# Patient Record
Sex: Female | Born: 1980 | Hispanic: Yes | Marital: Married | Smoking: Never smoker
Health system: Southern US, Community
[De-identification: ages and names within clinical notes are randomized; demographics above are authoritative.]

## PROBLEM LIST (undated history)

## (undated) HISTORY — PX: NO PAST SURGERIES: SHX2092

---

## 2018-08-27 ENCOUNTER — Emergency Department
Admission: EM | Admit: 2018-08-27 | Discharge: 2018-08-27 | Disposition: A | Discharge status: Home / Self Care | Payer: Worker's Compensation | Attending: Emergency Medicine | Admitting: Emergency Medicine

## 2018-08-27 ENCOUNTER — Other Ambulatory Visit: Payer: Self-pay

## 2018-08-27 DIAGNOSIS — M79631 Pain in right forearm: Secondary | ICD-10-CM | POA: Diagnosis present

## 2018-08-27 DIAGNOSIS — M7711 Lateral epicondylitis, right elbow: Secondary | ICD-10-CM | POA: Insufficient documentation

## 2018-08-27 MED ORDER — MELOXICAM 15 MG PO TABS: 15.0000 mg | ORAL_TABLET | Freq: Every day | ORAL | 1 refills | Status: AC

## 2018-08-27 NOTE — ED Provider Notes (Signed)
[facility] Emergency Department Provider Note  ____________________________________________  Time seen: Approximately 8:21 PM  I have reviewed the triage vital signs and the nursing notes.   HISTORY  Chief Complaint Wrist Pain    HPI Stephanie Heath is a 37 y.o. female presents to the emergency department with right lateral forearm pain after engaging in repetitive tasks at work.  Patient denies numbness or tingling in the right hand.  No pain along the first dorsal extensor compartment.  Patient has never had similar symptoms in the past.  She currently rates her pain at 7 out of 10 in intensity and describes it as aching, worse with movement and relieved with rest.   No past medical history on file.  There are no active problems to display for this patient.     Prior to Admission medications   Medication Sig Start Date End Date Taking? Authorizing Provider  meloxicam (MOBIC) 15 MG tablet Take 1 tablet (15 mg total) by mouth daily for 7 days. 08/27/18 09/03/18  Orvil FeilWoods, Jaclyn M, PA-C    Allergies Patient has no known allergies.  No family history on file.  Social History Social History   Tobacco Use  . Smoking status: Not on file  Substance Use Topics  . Alcohol use: Not on file  . Drug use: Not on file     Review of Systems  Constitutional: No fever/chills Eyes: No visual changes. No discharge ENT: No upper respiratory complaints. Cardiovascular: no chest pain. Respiratory: no cough. No SOB. Gastrointestinal: No abdominal pain.  No nausea, no vomiting.  No diarrhea.  No constipation. Genitourinary: Negative for dysuria. No hematuria Musculoskeletal: Patient has right wrist pain.  Skin: Negative for rash, abrasions, lacerations, ecchymosis. Neurological: Negative for headaches, focal weakness or numbness.   ____________________________________________   PHYSICAL EXAM:  VITAL SIGNS: ED Triage Vitals [08/27/18 1902]  Enc Vitals Group      BP 101/71     Pulse Rate 63     Resp 16     Temp 98.3 F (36.8 C)     Temp Source Oral     SpO2 100 %     Weight 100 lb (45.4 kg)     Height 4\' 11"  (1.499 m)     Head Circumference      Peak Flow      Pain Score 8     Pain Loc      Pain Edu?      Excl. in GC?      Constitutional: Alert and oriented. Well appearing and in no acute distress. Eyes: Conjunctivae are normal. PERRL. EOMI. Head: Atraumatic. Cardiovascular: Normal rate, regular rhythm. Normal S1 and S2.  Good peripheral circulation. Respiratory: Normal respiratory effort without tachypnea or retractions. Lungs CTAB. Good air entry to the bases with no decreased or absent breath sounds. Musculoskeletal: Patient has 5 out of 5 strength in the upper extremities bilaterally and symmetrically.  Patient has reproducible pain with resisted extension at the right wrist.  Patient localizes pain to the right lateral epicondyle.  Patient has negative Tinel and Phalen. Negative Finkelstein test.  She is able to perform full range of motion without difficulty.  Palpable radial pulse, right. Neurologic:  Normal speech and language. No gross focal neurologic deficits are appreciated.  Skin:  Skin is warm, dry and intact. No rash noted.   ____________________________________________   LABS (all labs ordered are listed, but only abnormal results are displayed)  Labs Reviewed - No data to display ____________________________________________  EKG   ____________________________________________  RADIOLOGY   No results found.  ____________________________________________    PROCEDURES  Procedure(s) performed:    Procedures    Medications - No data to display   ____________________________________________   INITIAL IMPRESSION / ASSESSMENT AND PLAN / ED COURSE  Pertinent labs & imaging results that were available during my care of the patient were reviewed by me and considered in my medical decision making  (see chart for details).  Review of the [place] CSRS was performed in accordance of the [facility] prior to dispensing any controlled drugs.    Assessment and plan Lateral epicondylitis Patient presents to the emergency department with right lateral forearm pain after engaging in repetitive activities at work.  History and physical exam findings are consistent with lateral epicondylitis.  Ice and meloxicam were recommended.  Patient was given a wrist splint in the emergency department.  She was advised to follow-up with orthopedics as needed.  All patient questions were answered.     ____________________________________________  FINAL CLINICAL IMPRESSION(S) / ED DIAGNOSES  Final diagnoses:  Lateral epicondylitis of right elbow      NEW MEDICATIONS STARTED DURING THIS VISIT:  ED Discharge Orders         Ordered    meloxicam (MOBIC) 15 MG tablet  Daily     08/27/18 1954              This chart was dictated using voice recognition software/Dragon. Despite best efforts to proofread, errors can occur which can change the meaning. Any change was purely unintentional.    Orvil Feil, PA-C 08/27/18 2026    Sharyn Creamer, MD 08/27/18 [phone]

## 2018-08-27 NOTE — ED Triage Notes (Signed)
Pt states she was building something at work when she began to have right wrist pain extending to right elbow without known injury. No deformity or swelling noted.

## 2018-09-28 ENCOUNTER — Other Ambulatory Visit (HOSPITAL_COMMUNITY)
Admission: RE | Admit: 2018-09-28 | Discharge: 2018-09-28 | Disposition: A | Discharge status: Home / Self Care | Payer: BLUE CROSS/BLUE SHIELD | Source: Ambulatory Visit | Attending: Maternal Newborn | Admitting: Maternal Newborn

## 2018-09-28 ENCOUNTER — Ambulatory Visit (INDEPENDENT_AMBULATORY_CARE_PROVIDER_SITE_OTHER): Payer: Medicaid Other | Admitting: Maternal Newborn

## 2018-09-28 ENCOUNTER — Encounter: Payer: Self-pay | Admitting: Maternal Newborn

## 2018-09-28 VITALS — BP 80/60 | HR 60 | Ht 59.0 in | Wt 106.0 lb

## 2018-09-28 DIAGNOSIS — Z124 Encounter for screening for malignant neoplasm of cervix: Secondary | ICD-10-CM | POA: Insufficient documentation

## 2018-09-28 DIAGNOSIS — Z01419 Encounter for gynecological examination (general) (routine) without abnormal findings: Secondary | ICD-10-CM

## 2018-09-28 DIAGNOSIS — Z0000 Encounter for general adult medical examination without abnormal findings: Secondary | ICD-10-CM

## 2018-09-28 DIAGNOSIS — R350 Frequency of micturition: Secondary | ICD-10-CM

## 2018-09-28 NOTE — Progress Notes (Signed)
Gynecology Annual Exam  PCP: Patient, No Pcp Per  Chief Complaint:  Chief Complaint  Patient presents with  . Gynecologic Exam    d/c 2wks ago    History of Present Illness: Patient is a 37 y.o. [id number] presenting for an annual exam. The patient complains of urinary frequency today.   LMP: Patient's last menstrual period was 08/26/2018. Average Interval: regular, 30 days Duration of flow: 3 days Heavy Menses: yes Clots: no Intermenstrual Bleeding: no Postcoital Bleeding: no Dysmenorrhea: yes  The patient is sexually active. She currently uses none for contraception. She denies dyspareunia.  The patient does perform self breast exams.  There is no notable family history of breast or ovarian cancer in her family.  The patient wears seatbelts: yes.   The patient has regular exercise: no.    The patient denies current symptoms of depression.    Review of Systems: Review of Systems  Constitutional: Negative.   HENT: Negative.   Eyes: Negative.   Respiratory: Negative for cough, shortness of breath and wheezing.   Cardiovascular: Negative for chest pain and palpitations.  Gastrointestinal: Negative for abdominal pain, constipation, diarrhea and nausea.  Genitourinary: Positive for frequency. Negative for dysuria.  Skin: Negative.   Neurological: Negative.   Endo/Heme/Allergies: Negative.   Psychiatric/Behavioral: Negative.   All other systems reviewed and are negative.   Past Medical History:  History reviewed. No pertinent past medical history.  Past Surgical History:  Past Surgical History:  Procedure Laterality Date  . NO PAST SURGERIES      Gynecologic History:  Patient's last menstrual period was 08/26/2018. Contraception: none Last Pap: She reports that her last Pap was three years ago and that the result was normal.  Obstetric History: [id number]  Family History:  Family History  Problem Relation Age of Onset  . Diabetes Mother   . Cancer Cousin 28       thyroid    Social History:  Social History   Socioeconomic History  . Marital status: Married    Spouse name: Not on file  . Number of children: 2  . Years of education: 51  . Highest education level: Not on file  Occupational History  . Occupation: ASSEMBLY  Social Needs  . Financial resource strain: Not on file  . Food insecurity:    Worry: Not on file    Inability: Not on file  . Transportation needs:    Medical: Not on file    Non-medical: Not on file  Tobacco Use  . Smoking status: Never Smoker  . Smokeless tobacco: Never Used  Substance and Sexual Activity  . Alcohol use: Never    Frequency: Never  . Drug use: Never  . Sexual activity: Yes    Birth control/protection: None  Lifestyle  . Physical activity:    Days per week: Not on file    Minutes per session: Not on file  . Stress: Not on file  Relationships  . Social connections:    Talks on phone: Not on file    Gets together: Not on file    Attends religious service: Not on file    Active member of club or organization: Not on file    Attends meetings of clubs or organizations: Not on file    Relationship status: Not on file  . Intimate partner violence:    Fear of current or ex partner: Not on file    Emotionally abused: Not on file    Physically abused: Not on  file    Forced sexual activity: Not on file  Other Topics Concern  . Not on file  Social History Narrative  . Not on file    Allergies:  No Known Allergies  Medications: Prior to Admission medications   Not on File    Physical Exam Vitals: Blood pressure (!) 80/60, pulse 60, height 4\' 11"  (1.499 m), weight 106 lb (48.1 kg), last menstrual period 08/26/2018.  General: NAD HEENT: normocephalic, anicteric Thyroid: no enlargement, no palpable nodules Pulmonary: No increased work of breathing, CTAB Cardiovascular: RRR, no murmurs, rubs, or gallops Breast: Breasts symmetrical, no tenderness, no palpable nodules or masses, no skin or  nipple retraction present, no nipple discharge.  No axillary or supraclavicular lymphadenopathy. Abdomen: Soft, non-tender, non-distended.  Umbilicus without lesions.  No hepatomegaly, splenomegaly or masses palpable. No evidence of hernia  Genitourinary:  External: Normal external female genitalia.  Normal urethral  meatus, normal Bartholin's and Skene's glands.    Vagina: Normal vaginal mucosa, no evidence of prolapse.    Cervix: Grossly normal in appearance, small amount of  menstrual bleeding, cycle is starting  Uterus: Non-enlarged, mobile, normal contour.  No CMT  Adnexa: ovaries non-enlarged, no adnexal masses  Rectal: deferred  Lymphatic: no evidence of inguinal lymphadenopathy Extremities: no edema, erythema, or tenderness Neurologic: Grossly intact Psychiatric: mood appropriate, affect full  Assessment: 37 y.o. G4W1027 for a routine annual exam with urinary frequency.  Plan: Problem List Items Addressed This Visit    None    Visit Diagnoses    Women's annual routine gynecological examination    -  Primary   Pap smear for cervical cancer screening       Relevant Orders   Cytology - PAP   Increased urinary frequency       Relevant Orders   Urine Culture      1) STI screening was offered and declined.  2) ASCCP guidelines and rationale discussed.  Patient opts for every 3 year screening interval. Pap done today.  3) Contraception - Declines contraception.  4) Routine healthcare maintenance including cholesterol, diabetes screening: Declines  5) Urine culture sent to rule out UTI as cause of frequent urination.  6) Follow up 1 year for routine annual exam.  Marcelyn Bruins, CNM 09/28/2018  10:07 AM

## 2018-09-30 LAB — URINE CULTURE: Organism ID, Bacteria: NO GROWTH

## 2018-10-01 LAB — CYTOLOGY - PAP
DIAGNOSIS: NEGATIVE
HPV: NOT DETECTED

## 2019-05-31 ENCOUNTER — Other Ambulatory Visit: Payer: Self-pay

## 2019-05-31 ENCOUNTER — Ambulatory Visit
Admission: EM | Admit: 2019-05-31 | Discharge: 2019-05-31 | Disposition: A | Discharge status: Home / Self Care | Payer: BC Managed Care – PPO | Attending: Urgent Care | Admitting: Urgent Care

## 2019-05-31 ENCOUNTER — Encounter: Payer: Self-pay | Admitting: Emergency Medicine

## 2019-05-31 DIAGNOSIS — Z833 Family history of diabetes mellitus: Secondary | ICD-10-CM

## 2019-05-31 DIAGNOSIS — E860 Dehydration: Secondary | ICD-10-CM | POA: Diagnosis not present

## 2019-05-31 DIAGNOSIS — R748 Abnormal levels of other serum enzymes: Secondary | ICD-10-CM | POA: Diagnosis not present

## 2019-05-31 DIAGNOSIS — R1010 Upper abdominal pain, unspecified: Secondary | ICD-10-CM | POA: Diagnosis not present

## 2019-05-31 DIAGNOSIS — N7091 Salpingitis, unspecified: Secondary | ICD-10-CM | POA: Diagnosis present

## 2019-05-31 DIAGNOSIS — Z79891 Long term (current) use of opiate analgesic: Secondary | ICD-10-CM

## 2019-05-31 DIAGNOSIS — Z79899 Other long term (current) drug therapy: Secondary | ICD-10-CM

## 2019-05-31 DIAGNOSIS — K8500 Idiopathic acute pancreatitis without necrosis or infection: Principal | ICD-10-CM | POA: Diagnosis present

## 2019-05-31 DIAGNOSIS — N200 Calculus of kidney: Secondary | ICD-10-CM | POA: Diagnosis present

## 2019-05-31 DIAGNOSIS — K5289 Other specified noninfective gastroenteritis and colitis: Secondary | ICD-10-CM | POA: Diagnosis present

## 2019-05-31 DIAGNOSIS — Z1159 Encounter for screening for other viral diseases: Secondary | ICD-10-CM

## 2019-05-31 DIAGNOSIS — R1013 Epigastric pain: Secondary | ICD-10-CM | POA: Diagnosis not present

## 2019-05-31 LAB — COMPREHENSIVE METABOLIC PANEL
ALT: 14 U/L (ref 0–44)
AST: 16 U/L (ref 15–41)
Albumin: 4.1 g/dL (ref 3.5–5.0)
Alkaline Phosphatase: 69 U/L (ref 38–126)
Anion gap: 8 (ref 5–15)
BUN: 16 mg/dL (ref 6–20)
CO2: 26 mmol/L (ref 22–32)
Calcium: 8.9 mg/dL (ref 8.9–10.3)
Chloride: 101 mmol/L (ref 98–111)
Creatinine, Ser: 0.63 mg/dL (ref 0.44–1.00)
GFR calc Af Amer: >60 mL/min (ref >60)
GFR calc non Af Amer: >60 mL/min (ref >60)
Glucose, Bld: 92 mg/dL (ref 70–99)
Potassium: 3.4 mmol/L — ABNORMAL LOW (ref 3.5–5.1)
Sodium: 135 mmol/L (ref 135–145)
Total Bilirubin: 0.7 mg/dL (ref 0.3–1.2)
Total Protein: 7.6 g/dL (ref 6.5–8.1)

## 2019-05-31 LAB — URINALYSIS, COMPLETE (UACMP) WITH MICROSCOPIC
Bacteria, UA: NONE SEEN
Bilirubin Urine: NEGATIVE
Glucose, UA: NEGATIVE mg/dL
Hgb urine dipstick: NEGATIVE
Ketones, ur: 40 mg/dL — AB
Leukocytes,Ua: NEGATIVE
Nitrite: NEGATIVE
Protein, ur: NEGATIVE mg/dL
Specific Gravity, Urine: 1.02 (ref 1.005–1.030)
pH: 5 (ref 5.0–8.0)

## 2019-05-31 LAB — CBC WITH DIFFERENTIAL/PLATELET
Abs Immature Granulocytes: 0.02 10*3/uL (ref 0.00–0.07)
Basophils Absolute: 0 10*3/uL (ref 0.0–0.1)
Basophils Relative: 0 %
Eosinophils Absolute: 0.4 10*3/uL (ref 0.0–0.5)
Eosinophils Relative: 4 %
HCT: 43.1 % (ref 36.0–46.0)
Hemoglobin: 15.2 g/dL — ABNORMAL HIGH (ref 12.0–15.0)
Immature Granulocytes: 0 %
Lymphocytes Relative: 14 %
Lymphs Abs: 1.4 10*3/uL (ref 0.7–4.0)
MCH: 30.3 pg (ref 26.0–34.0)
MCHC: 35.3 g/dL (ref 30.0–36.0)
MCV: 86 fL (ref 80.0–100.0)
Monocytes Absolute: 0.8 10*3/uL (ref 0.1–1.0)
Monocytes Relative: 7 %
Neutro Abs: 7.8 10*3/uL — ABNORMAL HIGH (ref 1.7–7.7)
Neutrophils Relative %: 75 %
Platelets: 229 10*3/uL (ref 150–400)
RBC: 5.01 MIL/uL (ref 3.87–5.11)
RDW: 12.9 % (ref 11.5–15.5)
WBC: 10.4 10*3/uL (ref 4.0–10.5)
nRBC: 0 % (ref 0.0–0.2)

## 2019-05-31 LAB — LIPASE, BLOOD: Lipase: 1360 U/L — ABNORMAL HIGH (ref 11–51)

## 2019-05-31 LAB — PREGNANCY, URINE: Preg Test, Ur: NEGATIVE

## 2019-05-31 MED ORDER — LOPERAMIDE HCL 2 MG PO CAPS: 2.0000 mg | ORAL_CAPSULE | Freq: Four times a day (QID) | ORAL | 0 refills | Status: DC | PRN

## 2019-05-31 MED ORDER — ONDANSETRON HCL 4 MG PO TABS: 4.0000 mg | ORAL_TABLET | Freq: Four times a day (QID) | ORAL | 0 refills | Status: DC

## 2019-05-31 MED ORDER — HYDROCODONE-ACETAMINOPHEN 5-325 MG PO TABS: 1.0000 | ORAL_TABLET | Freq: Three times a day (TID) | ORAL | 0 refills | Status: DC | PRN

## 2019-05-31 NOTE — ED Triage Notes (Signed)
Patient complains of upper abdominal pain, nausea and vomiting that started yesterday. Patient states that she has also been having diarrhea.

## 2019-05-31 NOTE — ED Provider Notes (Addendum)
[address], [place], [place] [address] [phone]   Name: Stephanie Heath DOB: 07/06/1981 MRN: [id number] CSN: [id number] PCP: Patient, No Pcp Per  Arrival date and time:  05/31/19 1640  Chief Complaint:  Abdominal Pain  NOTE: Prior to seeing the patient today, I have reviewed the triage nursing documentation and vital signs. Clinical staff has updated patient's PMH/PSHx, current medication list, and drug allergies/intolerances to ensure comprehensive history available to assist in medical decision making.   History:   HPI: Stephanie Heath is a 38 y.o. female who presents today with complaints of upper abdominal pain that started with acute onset yesterday (05/30/2019).  Patient notes that pain is sharp and stabbing in nature, and radiates virtually across the entire upper aspect of her abdomen.  Pain does not radiate into her back or shoulder. She has had associated nausea, vomiting, diarrhea.  Pain has been constant and does not seem to be influenced by eating. Her oral intake has been decrease overall since onset of her symptoms. Patient denies any fevers or chills.  Patient denies any urinary symptoms; no dysuria, frequency, urgency, or lower back pain.  Patient denies alcohol consumption.  History reviewed. No pertinent past medical history.  Past Surgical History:  Procedure Laterality Date  . NO PAST SURGERIES      Family History  Problem Relation Age of Onset  . Diabetes Mother   . Cancer Cousin 57       thyroid    Social History   Socioeconomic History  . Marital status: Married    Spouse name: Not on file  . Number of children: 2  . Years of education: 47  . Highest education level: Not on file  Occupational History  . Occupation: ASSEMBLY  Social Needs  . Financial resource strain: Not on file  . Food insecurity    Worry: Not on file    Inability: Not on file  . Transportation needs    Medical: Not on file    Non-medical: Not on file  Tobacco  Use  . Smoking status: Never Smoker  . Smokeless tobacco: Never Used  Substance and Sexual Activity  . Alcohol use: Never    Frequency: Never  . Drug use: Never  . Sexual activity: Yes    Birth control/protection: None  Lifestyle  . Physical activity    Days per week: Not on file    Minutes per session: Not on file  . Stress: Not on file  Relationships  . Social Herbalist on phone: Not on file    Gets together: Not on file    Attends religious service: Not on file    Active member of club or organization: Not on file    Attends meetings of clubs or organizations: Not on file    Relationship status: Not on file  . Intimate partner violence    Fear of current or ex partner: Not on file    Emotionally abused: Not on file    Physically abused: Not on file    Forced sexual activity: Not on file  Other Topics Concern  . Not on file  Social History Narrative  . Not on file    There are no active problems to display for this patient.   Home Medications:    No outpatient medications have been marked as taking for the 05/31/19 encounter [facility] Encounter).    Allergies:   Patient has no known allergies.  Review of Systems (ROS): Review of Systems  Constitutional: Positive for appetite change (Decreased). Negative for chills and fever.  HENT: Negative for congestion, rhinorrhea, sore throat and trouble swallowing.   Respiratory: Negative for cough and shortness of breath.   Cardiovascular: Negative for chest pain and palpitations.  Gastrointestinal: Positive for abdominal pain (Upper), diarrhea, nausea and vomiting. Negative for constipation and rectal pain.  Genitourinary: Positive for frequency. Negative for dysuria, flank pain, hematuria, pelvic pain, urgency and vaginal pain.  Musculoskeletal: Negative for back pain and neck pain.  Skin: Negative for pallor and rash.  Neurological: Negative for dizziness, syncope, weakness, numbness and headaches.   Hematological: Negative for adenopathy.  Psychiatric/Behavioral: The patient is nervous/anxious.      Physical Exam:  Triage Vital Signs ED Triage Vitals  Enc Vitals Group     BP 05/31/19 1654 115/88     Pulse Rate 05/31/19 1654 73     Resp 05/31/19 1654 18     Temp 05/31/19 1654 98.3 F (36.8 C)     Temp Source 05/31/19 1654 Oral     SpO2 05/31/19 1654 100 %     Weight 05/31/19 1653 110 lb (49.9 kg)     Height 05/31/19 1653 4\' 11"  (1.499 m)     Head Circumference --      Peak Flow --      Pain Score 05/31/19 1653 10     Pain Loc --      Pain Edu? --      Excl. in GC? --     Physical Exam  Constitutional: She is oriented to person, place, and time and well-developed, well-nourished, and in no distress. No distress.  HENT:  Head: Normocephalic and atraumatic.  Mouth/Throat: Oropharynx is clear and moist and mucous membranes are normal.  Eyes: Pupils are equal, round, and reactive to light. EOM are normal.  Neck: Normal range of motion. Neck supple.  Cardiovascular: Normal rate, regular rhythm, normal heart sounds and intact distal pulses. Exam reveals no gallop and no friction rub.  No murmur heard. Pulmonary/Chest: Effort normal and breath sounds normal. No respiratory distress. She has no wheezes. She has no rales.  Abdominal: Soft. Normal appearance and bowel sounds are normal. She exhibits no distension and no ascites. There is no hepatosplenomegaly. There is abdominal tenderness in the right upper quadrant, epigastric area and left upper quadrant. There is no rebound and no CVA tenderness.  Lymphadenopathy:    She has no cervical adenopathy.  Neurological: She is alert and oriented to person, place, and time.  Skin: Skin is warm and dry. No rash noted. She is not diaphoretic. No erythema.  Psychiatric: Affect and judgment normal. Her mood appears anxious.  Nursing note and vitals reviewed.    Urgent Care Treatments / Results:   LABS: PLEASE NOTE: all labs that  were ordered this encounter are listed, however only abnormal results are displayed. Labs Reviewed  CBC WITH DIFFERENTIAL/PLATELET - Abnormal; Notable for the following components:      Result Value   Hemoglobin 15.2 (*)    Neutro Abs 7.8 (*)    All other components within normal limits  COMPREHENSIVE METABOLIC PANEL - Abnormal; Notable for the following components:   Potassium 3.4 (*)    All other components within normal limits  LIPASE, BLOOD - Abnormal; Notable for the following components:   Lipase 1,360 (*)    All other components within normal limits  URINALYSIS, COMPLETE (UACMP) WITH MICROSCOPIC - Abnormal; Notable for the following components:   Ketones, ur 40 (*)  All other components within normal limits  PREGNANCY, URINE    EKG: -None  RADIOLOGY: No results found.  PROCEDURES: Procedures  MEDICATIONS RECEIVED THIS VISIT: Medications - No data to display  PERTINENT CLINICAL COURSE NOTES/UPDATES:   Initial Impression / Assessment and Plan / Urgent Care Course:    Stephanie Heath is a 38 y.o. female who [facility] to [facility] today with complaints of Abdominal Pain  Pertinent labs & imaging results that were available during my care of the patient were personally reviewed by me and considered in my medical decision making (see lab/imaging section of note for values and interpretations).  Exam reveals generalized upper abdominal tenderness that does not radiate into back or shoulder.  Nausea controlled in clinic.  She has not experienced any episodes of emesis or diarrhea while here. Her VS are noted to be stable. CBC and CMP normal.  UA positive for ketonuria, however negative for infection.  Her urine HCG was negative.  Lipase returned elevated at 1360 U/L. In light of the fact that patient does not consume ETOH, we discussed that there is concern for an obstructive etiology causing her pain and elevated lipase level. With the tenderness noted on exam,  cholecystitis with concurrent choledocholithiasis is a distinct possibility.    Ultrasound imaging not available in the urgent care setting at this time.  Reviewed need for patient to be seen and the emergency department for further evaluation.  Patient will ultimately need ultrasound imaging, IV fluids, and IV pain medication.  Patient in agreement with plan of care, however notes that she cannot go to the ER until after her husband gets off of work around 10 or 11 tonight.  Offered EMS transportation, however patient refused. Patient was provided written information on abdominal pain and pancreatitis on her AVS today as I wanted her to have information on her elevated enzyme level. Discussed that she further testing would be requested to confirm pancreatitis diagnosis. Deferred patient information on cholecystitis/choledocholithiasis in the absence of confirmatory imaging.  Will send in prescription for pain medication (Norco 5/325), nausea medication (ondansetron), and antidiarrheal (loperamide) for patient to use until she can get to the emergency department later on tonight.  I have spoken with patient's husband via telephone and informed him patient symptoms and my concerns.  Husband verbalizes that he will take patient into the emergency department later on tonight as discussed.  Patient report communicated to the [facility] emergency department. Spoke with Beaulah Corinoyne, RN via [facility] secure chat.  Advised of presenting complaints, assessment, and discharge plan of care that included clear directives for the patient to present to the emergency department for further evaluation and care tonight.   Made aware the patient will be presenting to their facility later on tonight via POV.   Final Clinical Impressions(s) / Urgent Care Diagnoses:   1. Pain of upper abdomen   2. Elevated lipase     New Prescriptions:   Meds ordered this encounter  Medications  . ondansetron (ZOFRAN) 4 MG tablet    Sig: Take 1 tablet  (4 mg total) by mouth every 6 (six) hours.    Dispense:  12 tablet    Refill:  0  . loperamide (IMODIUM) 2 MG capsule    Sig: Take 1 capsule (2 mg total) by mouth 4 (four) times daily as needed for diarrhea or loose stools.    Dispense:  12 capsule    Refill:  0  . HYDROcodone-acetaminophen (NORCO) 5-325 MG tablet  Sig: Take 1 tablet by mouth every 8 (eight) hours as needed for moderate pain.    Dispense:  12 tablet    Refill:  0    Controlled Substance Prescriptions:  [place] Controlled Substance Registry consulted? Yes -  I have consulted the [place] Controlled Substances Registry for this patient, and feel the risk/benefit ratio today is favorable for proceeding with this prescription for a controlled substance.  NOTE: This note was prepared using Scientist, clinical (histocompatibility and immunogenetics)Dragon dictation software along with smaller Lobbyistphrase technology. Despite my best ability to proofread, there is the potential that transcriptional errors may still occur from this process, and are completely unintentional.      Verlee MonteGray, Taariq Leitz E, NP 05/31/19 [phone]

## 2019-05-31 NOTE — Discharge Instructions (Addendum)
I have given your some pain and nausea medication. You need to go to the ER tonight for further evalution. I want to try to control your symptoms until your husband can take you.   Honor Loh, MSN, APRN, FNP-C, CEN Advanced Practice Provider [facility] 05/31/2019 6:34 PM

## 2019-05-31 NOTE — ED Triage Notes (Signed)
Pt presents to ED from [facility] urgent care with c/o generalized abd pain/cramping and diarrhea for the past 4 days. Pt states she was seen at urgent care and they sent her to ED due to concerns that pt has pancreatitis. Pt states cramping increased today.

## 2019-06-01 ENCOUNTER — Inpatient Hospital Stay
Admission: EM | Admit: 2019-06-01 | Discharge: 2019-06-04 | DRG: 440 | Disposition: A | Discharge status: Home / Self Care | Payer: BC Managed Care – PPO | Source: Other Acute Inpatient Hospital | Attending: Internal Medicine | Admitting: Internal Medicine

## 2019-06-01 ENCOUNTER — Emergency Department: Payer: BC Managed Care – PPO

## 2019-06-01 ENCOUNTER — Other Ambulatory Visit: Payer: Self-pay

## 2019-06-01 DIAGNOSIS — R109 Unspecified abdominal pain: Secondary | ICD-10-CM | POA: Diagnosis present

## 2019-06-01 DIAGNOSIS — R1013 Epigastric pain: Secondary | ICD-10-CM

## 2019-06-01 DIAGNOSIS — K859 Acute pancreatitis without necrosis or infection, unspecified: Secondary | ICD-10-CM

## 2019-06-01 DIAGNOSIS — N7011 Chronic salpingitis: Secondary | ICD-10-CM

## 2019-06-01 LAB — COMPREHENSIVE METABOLIC PANEL
ALT: 14 U/L (ref 0–44)
AST: 17 U/L (ref 15–41)
Albumin: 4 g/dL (ref 3.5–5.0)
Alkaline Phosphatase: 66 U/L (ref 38–126)
Anion gap: 9 (ref 5–15)
BUN: 13 mg/dL (ref 6–20)
CO2: 28 mmol/L (ref 22–32)
Calcium: 8.9 mg/dL (ref 8.9–10.3)
Chloride: 102 mmol/L (ref 98–111)
Creatinine, Ser: 0.62 mg/dL (ref 0.44–1.00)
GFR calc Af Amer: >60 mL/min (ref >60)
GFR calc non Af Amer: >60 mL/min (ref >60)
Glucose, Bld: 111 mg/dL — ABNORMAL HIGH (ref 70–99)
Potassium: 3.6 mmol/L (ref 3.5–5.1)
Sodium: 139 mmol/L (ref 135–145)
Total Bilirubin: 0.6 mg/dL (ref 0.3–1.2)
Total Protein: 7.1 g/dL (ref 6.5–8.1)

## 2019-06-01 LAB — SARS CORONAVIRUS 2 BY RT PCR (HOSPITAL ORDER, PERFORMED IN ~~LOC~~ HOSPITAL LAB): SARS Coronavirus 2: NEGATIVE

## 2019-06-01 LAB — CBC WITH DIFFERENTIAL/PLATELET
Abs Immature Granulocytes: 0.03 10*3/uL (ref 0.00–0.07)
Basophils Absolute: 0 10*3/uL (ref 0.0–0.1)
Basophils Relative: 0 %
Eosinophils Absolute: 0.7 10*3/uL — ABNORMAL HIGH (ref 0.0–0.5)
Eosinophils Relative: 6 %
HCT: 41.1 % (ref 36.0–46.0)
Hemoglobin: 14.1 g/dL (ref 12.0–15.0)
Immature Granulocytes: 0 %
Lymphocytes Relative: 16 %
Lymphs Abs: 1.8 10*3/uL (ref 0.7–4.0)
MCH: 29.5 pg (ref 26.0–34.0)
MCHC: 34.3 g/dL (ref 30.0–36.0)
MCV: 86 fL (ref 80.0–100.0)
Monocytes Absolute: 0.7 10*3/uL (ref 0.1–1.0)
Monocytes Relative: 6 %
Neutro Abs: 8.2 10*3/uL — ABNORMAL HIGH (ref 1.7–7.7)
Neutrophils Relative %: 72 %
Platelets: 243 10*3/uL (ref 150–400)
RBC: 4.78 MIL/uL (ref 3.87–5.11)
RDW: 12.6 % (ref 11.5–15.5)
WBC: 11.4 10*3/uL — ABNORMAL HIGH (ref 4.0–10.5)
nRBC: 0 % (ref 0.0–0.2)

## 2019-06-01 LAB — LIPASE, BLOOD: Lipase: 127 U/L — ABNORMAL HIGH (ref 11–51)

## 2019-06-01 MED ORDER — HYDROCODONE-ACETAMINOPHEN 5-325 MG PO TABS
1.0000 | ORAL_TABLET | Freq: Four times a day (QID) | ORAL | Status: DC | PRN
  Administered 2019-06-02 (×2): 1 via ORAL
  Filled 2019-06-01 (×2): qty 1

## 2019-06-01 MED ORDER — ONDANSETRON HCL 4 MG PO TABS: 4.0000 mg | ORAL_TABLET | Freq: Four times a day (QID) | ORAL | Status: DC | PRN

## 2019-06-01 MED ORDER — SODIUM CHLORIDE 0.9 % IV SOLN
INTRAVENOUS | Status: DC
  Administered 2019-06-01 – 2019-06-04 (×8): via INTRAVENOUS

## 2019-06-01 MED ORDER — ONDANSETRON HCL 4 MG/2ML IJ SOLN
4.0000 mg | Freq: Four times a day (QID) | INTRAMUSCULAR | Status: DC | PRN
  Administered 2019-06-01: 4 mg via INTRAVENOUS
  Filled 2019-06-01: qty 2

## 2019-06-01 MED ORDER — SODIUM CHLORIDE 0.9 % IV BOLUS
1000.0000 mL | Freq: Once | INTRAVENOUS | Status: AC
  Administered 2019-06-01: 1000 mL via INTRAVENOUS

## 2019-06-01 MED ORDER — MORPHINE SULFATE (PF) 2 MG/ML IV SOLN
1.0000 mg | INTRAVENOUS | Status: DC | PRN
  Administered 2019-06-01 (×2): 2 mg via INTRAVENOUS
  Filled 2019-06-01 (×2): qty 1

## 2019-06-01 MED ORDER — ENOXAPARIN SODIUM 40 MG/0.4ML ~~LOC~~ SOLN
40.0000 mg | SUBCUTANEOUS | Status: DC
  Administered 2019-06-01 – 2019-06-04 (×4): 40 mg via SUBCUTANEOUS
  Filled 2019-06-01 (×4): qty 0.4

## 2019-06-01 MED ORDER — ONDANSETRON HCL 4 MG/2ML IJ SOLN
4.0000 mg | Freq: Once | INTRAMUSCULAR | Status: AC
  Administered 2019-06-01: 4 mg via INTRAVENOUS
  Filled 2019-06-01: qty 2

## 2019-06-01 MED ORDER — ACETAMINOPHEN 325 MG PO TABS: 650.0000 mg | ORAL_TABLET | Freq: Four times a day (QID) | ORAL | Status: DC | PRN

## 2019-06-01 MED ORDER — FENTANYL CITRATE (PF) 100 MCG/2ML IJ SOLN
50.0000 ug | Freq: Once | INTRAMUSCULAR | Status: AC
  Administered 2019-06-01: 50 ug via INTRAVENOUS
  Filled 2019-06-01: qty 2

## 2019-06-01 MED ORDER — ACETAMINOPHEN 650 MG RE SUPP: 650.0000 mg | Freq: Four times a day (QID) | RECTAL | Status: DC | PRN

## 2019-06-01 NOTE — ED Notes (Signed)
ED TO INPATIENT HANDOFF REPORT  ED Nurse Name and Phone #: Lorriane Shire [phone]  S Name/Age/Gender Stephanie Heath 38 y.o. female Room/Bed: ED16A/ED16A  Code Status   Code Status: Not on file  Home/SNF/Other Home Patient oriented to: self, place, time and situation Is this baseline? Yes   Triage Complete: Triage complete  Chief Complaint abd pain  Triage Note Pt presents to ED from [facility] urgent care with c/o generalized abd pain/cramping and diarrhea for the past 4 days. Pt states she was seen at urgent care and they sent her to ED due to concerns that pt has pancreatitis. Pt states cramping increased today.    Allergies No Known Allergies  Level of Care/Admitting Diagnosis ED Disposition    ED Disposition Condition Comment   Admit  The patient appears reasonably stabilized for admission considering the current resources, flow, and capabilities available in the ED at this time, and I doubt any other [facility] requiring further screening and/or treatment in the ED prior to admission is  present.       B Medical/Surgery History History reviewed. No pertinent past medical history. Past Surgical History:  Procedure Laterality Date  . NO PAST SURGERIES       A IV Location/Drains/Wounds Patient Lines/Drains/Airways Status   Active Line/Drains/Airways    Name:   Placement date:   Placement time:   Site:   Days:   Peripheral IV 06/01/19 Right Hand   06/01/19    0110    Hand   less than 1          Intake/Output Last 24 hours No intake or output data in the 24 hours ending 06/01/19 0255  Labs/Imaging Results for orders placed or performed during the hospital encounter of 06/01/19 (from the past 48 hour(s))  CBC with Differential     Status: Abnormal   Collection Time: 06/01/19  1:17 AM  Result Value Ref Range   WBC 11.4 (H) 4.0 - 10.5 K/uL   RBC 4.78 3.87 - 5.11 MIL/uL   Hemoglobin 14.1 12.0 - 15.0 g/dL   HCT 41.1 36.0 - 46.0 %   MCV 86.0 80.0 - 100.0 fL   MCH 29.5 26.0 -  34.0 pg   MCHC 34.3 30.0 - 36.0 g/dL   RDW 12.6 11.5 - 15.5 %   Platelets 243 150 - 400 K/uL   nRBC 0.0 0.0 - 0.2 %   Neutrophils Relative % 72 %   Neutro Abs 8.2 (H) 1.7 - 7.7 K/uL   Lymphocytes Relative 16 %   Lymphs Abs 1.8 0.7 - 4.0 K/uL   Monocytes Relative 6 %   Monocytes Absolute 0.7 0.1 - 1.0 K/uL   Eosinophils Relative 6 %   Eosinophils Absolute 0.7 (H) 0.0 - 0.5 K/uL   Basophils Relative 0 %   Basophils Absolute 0.0 0.0 - 0.1 K/uL   Immature Granulocytes 0 %   Abs Immature Granulocytes 0.03 0.00 - 0.07 K/uL    Comment: Performed at [facility], [address] [place], [place] [address]  Comprehensive metabolic panel     Status: Abnormal   Collection Time: 06/01/19  1:17 AM  Result Value Ref Range   Sodium 139 135 - 145 mmol/L   Potassium 3.6 3.5 - 5.1 mmol/L   Chloride 102 98 - 111 mmol/L   CO2 28 22 - 32 mmol/L   Glucose, Bld 111 (H) 70 - 99 mg/dL   BUN 13 6 - 20 mg/dL   Creatinine, Ser 0.62 0.44 - 1.00  mg/dL   Calcium 8.9 8.9 - 16.110.3 mg/dL   Total Protein 7.1 6.5 - 8.1 g/dL   Albumin 4.0 3.5 - 5.0 g/dL   AST 17 15 - 41 U/L   ALT 14 0 - 44 U/L   Alkaline Phosphatase 66 38 - 126 U/L   Total Bilirubin 0.6 0.3 - 1.2 mg/dL   GFR calc non Af Amer >60 >60 mL/min   GFR calc Af Amer >60 >60 mL/min   Anion gap 9 5 - 15    Comment: Performed at [facility], [address] [place], [place] [address]  Lipase, blood     Status: Abnormal   Collection Time: 06/01/19  1:17 AM  Result Value Ref Range   Lipase 127 (H) 11 - 51 U/L    Comment: Performed at [facility], [address] [place], [place] [address]  SARS Coronavirus 2 (CEPHEID - Performed in [facility] hospital lab), Hosp Order     Status: None   Collection Time: 06/01/19  1:17 AM   Specimen: Nasopharyngeal Swab  Result Value Ref Range   SARS Coronavirus 2 NEGATIVE NEGATIVE    Comment: (NOTE) If result is NEGATIVE SARS-CoV-2 target nucleic acids are NOT DETECTED. The  SARS-CoV-2 RNA is generally detectable in upper and lower  respiratory specimens during the acute phase of infection. The lowest  concentration of SARS-CoV-2 viral copies this assay can detect is 250  copies / mL. A negative result does not preclude SARS-CoV-2 infection  and should not be used as the sole basis for treatment or other  patient management decisions.  A negative result may occur with  improper specimen collection / handling, submission of specimen other  than nasopharyngeal swab, presence of viral mutation(s) within the  areas targeted by this assay, and inadequate number of viral copies  (<250 copies / mL). A negative result must be combined with clinical  observations, patient history, and epidemiological information. If result is POSITIVE SARS-CoV-2 target nucleic acids are DETECTED. The SARS-CoV-2 RNA is generally detectable in upper and lower  respiratory specimens dur ing the acute phase of infection.  Positive  results are indicative of active infection with SARS-CoV-2.  Clinical  correlation with patient history and other diagnostic information is  necessary to determine patient infection status.  Positive results do  not rule out bacterial infection or co-infection with other viruses. If result is PRESUMPTIVE POSTIVE SARS-CoV-2 nucleic acids MAY BE PRESENT.   A presumptive positive result was obtained on the submitted specimen  and confirmed on repeat testing.  While 2019 novel coronavirus  (SARS-CoV-2) nucleic acids may be present in the submitted sample  additional confirmatory testing may be necessary for epidemiological  and / or clinical management purposes  to differentiate between  SARS-CoV-2 and other Sarbecovirus currently known to infect humans.  If clinically indicated additional testing with an alternate test  methodology [phone]([id number]) is advised. The SARS-CoV-2 RNA is generally  detectable in upper and lower respiratory sp ecimens during the acute   phase of infection. The expected result is Negative. Fact Sheet for Patients:  BoilerBrush.com.cyhttps://www.fda.gov/media/136312/download Fact Sheet for Healthcare Providers: https://pope.com/https://www.fda.gov/media/136313/download This test is not yet approved or cleared by the [place] FDA and has been authorized for detection and/or diagnosis of SARS-CoV-2 by FDA under an Emergency Use Authorization (EUA).  This EUA will remain in effect (meaning this test can be used) for the duration of the COVID-19 declaration under Section 564(b)(1) of the Act, 21 U.S.C. section 360bbb-3(b)(1), unless  the authorization is terminated or revoked sooner. Performed at [facility], [address] [place], [place] [address]    [place] Abdomen Limited Ruq  Result Date: 06/01/2019 CLINICAL DATA:  pancreatitis, evaluate for cholecystitis EXAM: ULTRASOUND ABDOMEN LIMITED RIGHT UPPER QUADRANT COMPARISON:  None. FINDINGS: Gallbladder: No gallstones or wall thickening visualized. No sonographic Murphy sign noted by sonographer. Common bile duct: Diameter: Normal caliber, 4 mm Liver: Small benign calcification. Normal echotexture. No suspicious mass or biliary duct dilatation. Portal vein is patent on color Doppler imaging with normal direction of blood flow towards the liver. IMPRESSION: No acute findings.  No evidence of cholelithiasis or cholecystitis. Electronically Signed   By: Charlett NoseKevin  Dover M.D.   On: 06/01/2019 02:18    Pending Labs Unresulted Labs (From admission, onward)   None      Vitals/Pain Today's Vitals   05/31/19 2246 05/31/19 2247  BP: 104/74   Pulse: 76   Resp: 18   Temp: 98.1 F (36.7 C)   TempSrc: Oral   SpO2: 97%   Weight:  49.9 kg  Height:  4\' 11"  (1.499 m)  PainSc: 6      Isolation Precautions No active isolations  Medications Medications  sodium chloride 0.9 % bolus 1,000 mL (1,000 mLs Intravenous New Bag/Given 06/01/19 0116)  fentaNYL (SUBLIMAZE) injection 50 mcg (50 mcg Intravenous  Given 06/01/19 0116)  ondansetron (ZOFRAN) injection 4 mg (4 mg Intravenous Given 06/01/19 0116)    Mobility walks Low fall risk   Focused Assessments Renal Assessment Handoff:     R Recommendations: See Admitting Provider Note  Report given to:   Additional Notes:

## 2019-06-01 NOTE — Consult Note (Signed)
GI Inpatient Consult Note  Reason for Consult: Epigastric abd pain, nausea/vomiting   Attending Requesting Consult: Dr. Rosilyn Mings, MD  History of Present Illness: Stephanie Heath is a 38 y.o. female seen for evaluation of epigastric abd pain with associated nausea/vomiting at the request of Dr. Marcille Blanco. Patient presented to the [facility] yesterday afternoon 2-day histroy of acute epigastric pain with associated nausea, vomiting, and diarrhea. Lab work-up revealed lipase 1360, WBC 10.4, hemoglobin 15.2, AST 16, ALT 14, alk phos 69. She was advised to come to the [facility] ED for imaging and pain management and her husband took her to the ED around 11 PM last night. Lipase upon admission to the [facility] ED showed lipase 127. She had RUQ US showing no gallstones or wall thickening and no extrahepatic biliary ductal dilatation.   Patient seen and examined this afternoon resting comfortably in hospital bed. She reports no recurrent nausea or episodes of vomiting. There have been no diarrhea episodes since admission. She was started on clear liquid diet and was able to eat some chicken broth and frozen ice. She reports after eating she had a little bit of bilateral upper abd pain, but that has resolved. Currently, she ranks her pain level a 4/10. She reports she feels a lot better than yesterday. She has not required any IV pain medication today. She reports on Monday evening she fixed lasagna for her family and reports there has been a recall on some ground beef for E.coli. She reports several members of her household have also experienced some diarrhea, but no one in the house has had abd pain. She denies any family history of pancreatitis or pancreatic cancer. There is no known family hx of autoimmune diseases. There have been no recent medication changes or OTC supplements. She denies any alcohol, tobacco, or illicit drug use. She denies any history of hypertriglyceridemia. There has been no unintentional  weight loss.    Last Colonoscopy: N/A Last Endoscopy: N/A   Past Medical History:  History reviewed. No pertinent past medical history.  Problem List: Patient Active Problem List   Diagnosis Date Noted  . Intractable abdominal pain 06/01/2019    Past Surgical History: Past Surgical History:  Procedure Laterality Date  . NO PAST SURGERIES      Allergies: No Known Allergies  Home Medications: Medications Prior to Admission  Medication Sig Dispense Refill Last Dose  . HYDROcodone-acetaminophen (NORCO) 5-325 MG tablet Take 1 tablet by mouth every 8 (eight) hours as needed for moderate pain. 12 tablet 0 prn at prn  . loperamide (IMODIUM) 2 MG capsule Take 1 capsule (2 mg total) by mouth 4 (four) times daily as needed for diarrhea or loose stools. 12 capsule 0 prn at prn  . ondansetron (ZOFRAN) 4 MG tablet Take 1 tablet (4 mg total) by mouth every 6 (six) hours. 12 tablet 0 prn at prn   Home medication reconciliation was completed with the patient.   Scheduled Inpatient Medications:   . enoxaparin (LOVENOX) injection  40 mg Subcutaneous Q24H    Continuous Inpatient Infusions:   . sodium chloride 100 mL/hr at 06/01/19 1200    PRN Inpatient Medications:  acetaminophen **OR** acetaminophen, morphine injection, ondansetron **OR** ondansetron (ZOFRAN) IV  Family History: family history includes Cancer (age of onset: 9) in her cousin; Diabetes in her mother.  The patient's family history is negative for inflammatory bowel disorders, GI malignancy, or solid organ transplantation.  Social History:   reports that she has never smoked. She  has never used smokeless tobacco. She reports that she does not drink alcohol or use drugs. The patient denies ETOH, tobacco, or drug use.   Review of Systems: Constitutional: Weight is stable.  Eyes: No changes in vision. ENT: No oral lesions, sore throat.  GI: see HPI.  Heme/Lymph: No easy bruising.  CV: No chest pain.  GU: No hematuria.   Integumentary: No rashes.  Neuro: No headaches.  Psych: No depression/anxiety.  Endocrine: No heat/cold intolerance.  Allergic/Immunologic: No urticaria.  Resp: No cough, SOB.  Musculoskeletal: No joint swelling.    Physical Examination: BP 100/71 (BP Location: Left Arm)   Pulse 69   Temp 98.2 F (36.8 C) (Oral)   Resp 15   Ht _0  (1.499 m)   Wt 49.9 kg   LMP 05/24/2019   SpO2 99%   BMI 22.22 kg/m   Non-toxic appearing Hispanic female in hospital bed, no accessory muscle use Gen: NAD, alert and oriented x 4 HEENT: PEERLA, EOMI, Neck: supple, no JVD or thyromegaly Chest: CTA bilaterally, no wheezes, crackles, or other adventitious sounds CV: RRR, no m/g/c/r Abd: soft, nondistended, hyperactive BS in all four quadrants; nontender to palpation in RUQ, epigastric, LUQ, no lower abd tenderness to light or deep palpation, no HSM, guarding, ridigity, or rebound tenderness Ext: no edema, well perfused with 2+ pulses, Skin: no rash or lesions noted Lymph: no LAD  Data: Lab Results  Component Value Date   WBC 11.4 (H) 06/01/2019   HGB 14.1 06/01/2019   HCT 41.1 06/01/2019   MCV 86.0 06/01/2019   PLT 243 06/01/2019   Recent Labs  Lab 05/31/19 1725 06/01/19 0117  HGB 15.2* 14.1   Lab Results  Component Value Date   NA 139 06/01/2019   K 3.6 06/01/2019   CL 102 06/01/2019   CO2 28 06/01/2019   BUN 13 06/01/2019   CREATININE 0.62 06/01/2019   Lab Results  Component Value Date   ALT 14 06/01/2019   AST 17 06/01/2019   ALKPHOS 66 06/01/2019   BILITOT 0.6 06/01/2019   No results for input(s): APTT, INR, PTT in the last 168 hours. Assessment/Plan:  38 y/o Hispanic female with no significant PMH admitted last night for 2-day history of epigastric pain with associated nausea/vomiting  1. Acute pancreatitis of unknown etiology  - Clinical diagnosis of pancreatitis with lipase >1300 yesterday afternoon. In [facility] ED, lipase had decreased to 127. WBC 11.4 last  night. - RUQ [place] reviewed and negative for gallstones, wall thickening, or biliary ductal dilatation - No alcohol history - We need to check triglyceride level - Continue IV fluids today. May D/C tomorrow. - Low suspicion for autoimmune etiology at this time. If recurrent episodes occur, consider checking IgG4 - Patient is currently tolerating a clear liquid diet with no significant abd pain, nausea, or vomiting. Continue clear liquid diet tonight. Advance to soft diet tomorrow if she continues to improve.  - We will continue to follow along    Thank you for the consult. Please call with questions or concerns.  Reeves Forth [facility] [phone] (Cell)

## 2019-06-01 NOTE — ED Provider Notes (Signed)
[facility] Emergency Department Provider Note   ____________________________________________   First MD Initiated Contact with Patient 06/01/19 0103     (approximate)  I have reviewed the triage vital signs and the nursing notes.   HISTORY  Chief Complaint Diarrhea and Abdominal Pain    HPI Malachi BondsGloria Cosman is a 38 y.o. female referred from med in urgent care with a chief complaint of pancreatitis.  Patient reports a 3-day history of epigastric abdominal pain associated with one episode of nausea/vomiting.  Began to have diarrhea yesterday.  Work-up at urgent care revealed lipase > 1300.  Sent to the ED for further work-up with ultrasound.  Patient denies fever, cough, chest pain, shortness of breath, dysuria.  Denies recent travel, trauma or exposure to persons diagnosed with coronavirus.       Past medical history None  There are no active problems to display for this patient.   Past Surgical History:  Procedure Laterality Date  . NO PAST SURGERIES      Prior to Admission medications   Medication Sig Start Date End Date Taking? Authorizing Provider  HYDROcodone-acetaminophen (NORCO) 5-325 MG tablet Take 1 tablet by mouth every 8 (eight) hours as needed for moderate pain. 05/31/19   Verlee MonteGray, Bryan E, NP  loperamide (IMODIUM) 2 MG capsule Take 1 capsule (2 mg total) by mouth 4 (four) times daily as needed for diarrhea or loose stools. 05/31/19   Verlee MonteGray, Bryan E, NP  ondansetron (ZOFRAN) 4 MG tablet Take 1 tablet (4 mg total) by mouth every 6 (six) hours. 05/31/19   Verlee MonteGray, Bryan E, NP    Allergies Patient has no known allergies.  Family History  Problem Relation Age of Onset  . Diabetes Mother   . Cancer Cousin 7632       thyroid    Social History Social History   Tobacco Use  . Smoking status: Never Smoker  . Smokeless tobacco: Never Used  Substance Use Topics  . Alcohol use: Never    Frequency: Never  . Drug use: Never    Review of Systems  Constitutional: No fever/chills Eyes: No visual changes. ENT: No sore throat. Cardiovascular: Denies chest pain. Respiratory: Denies shortness of breath. Gastrointestinal: Positive for abdominal pain.  Positive for 1 episode of vomiting.  Positive for diarrhea.  No constipation. Genitourinary: Negative for dysuria. Musculoskeletal: Negative for back pain. Skin: Negative for rash. Neurological: Negative for headaches, focal weakness or numbness.   ____________________________________________   PHYSICAL EXAM:  VITAL SIGNS: ED Triage Vitals  Enc Vitals Group     BP 05/31/19 2246 104/74     Pulse Rate 05/31/19 2246 76     Resp 05/31/19 2246 18     Temp 05/31/19 2246 98.1 F (36.7 C)     Temp Source 05/31/19 2246 Oral     SpO2 05/31/19 2246 97 %     Weight 05/31/19 2247 110 lb (49.9 kg)     Height 05/31/19 2247 4\' 11"  (1.499 m)     Head Circumference --      Peak Flow --      Pain Score 05/31/19 2246 6     Pain Loc --      Pain Edu? --      Excl. in GC? --     Constitutional: Alert and oriented. Well appearing and in mild acute distress. Eyes: Conjunctivae are normal. PERRL. EOMI. Head: Atraumatic. Nose: No congestion/rhinnorhea. Mouth/Throat: Mucous membranes are moist.  Oropharynx non-erythematous. Neck: No stridor.  Cardiovascular: Normal rate, regular rhythm. Grossly normal heart sounds.  Good peripheral circulation. Respiratory: Normal respiratory effort.  No retractions. Lungs CTAB. Gastrointestinal: Soft and mildly tender to palpation epigastrium without rebound or guarding. No distention. No abdominal bruits. No CVA tenderness. Musculoskeletal: No lower extremity tenderness nor edema.  No joint effusions. Neurologic:  Normal speech and language. No gross focal neurologic deficits are appreciated. No gait instability. Skin:  Skin is warm, dry and intact. No rash noted. Psychiatric: Mood and affect are normal. Speech and behavior are normal.   ____________________________________________   LABS (all labs ordered are listed, but only abnormal results are displayed)  Labs Reviewed  CBC WITH DIFFERENTIAL/PLATELET - Abnormal; Notable for the following components:      Result Value   WBC 11.4 (*)    Neutro Abs 8.2 (*)    Eosinophils Absolute 0.7 (*)    All other components within normal limits  COMPREHENSIVE METABOLIC PANEL - Abnormal; Notable for the following components:   Glucose, Bld 111 (*)    All other components within normal limits  LIPASE, BLOOD - Abnormal; Notable for the following components:   Lipase 127 (*)    All other components within normal limits  SARS CORONAVIRUS 2 (HOSPITAL ORDER, PERFORMED IN [facility])   ____________________________________________  EKG  None ____________________________________________  RADIOLOGY  ED MD interpretation: Unremarkable ultrasound  Official radiology report(s): Koreas Abdomen Limited Ruq  Result Date: 06/01/2019 CLINICAL DATA:  pancreatitis, evaluate for cholecystitis EXAM: ULTRASOUND ABDOMEN LIMITED RIGHT UPPER QUADRANT COMPARISON:  None. FINDINGS: Gallbladder: No gallstones or wall thickening visualized. No sonographic Murphy sign noted by sonographer. Common bile duct: Diameter: Normal caliber, 4 mm Liver: Small benign calcification. Normal echotexture. No suspicious mass or biliary duct dilatation. Portal vein is patent on color Doppler imaging with normal direction of blood flow towards the liver. IMPRESSION: No acute findings.  No evidence of cholelithiasis or cholecystitis. Electronically Signed   By: Charlett NoseKevin  Dover M.D.   On: 06/01/2019 02:18    ____________________________________________   PROCEDURES  Procedure(s) performed (including Critical Care):  Procedures   ____________________________________________   INITIAL IMPRESSION / ASSESSMENT AND PLAN / ED COURSE  As part of my medical decision making, I reviewed the following data within  the electronic MEDICAL RECORD NUMBER Nursing notes reviewed and incorporated, Labs reviewed, Old chart reviewed, Radiograph reviewed, Discussed with admitting physician Dr. Sheryle Hailiamond and Notes from prior ED visits     Malachi BondsGloria Maclellan was evaluated in Emergency Department on 06/01/2019 for the symptoms described in the history of present illness. She was evaluated in the context of the global COVID-19 pandemic, which necessitated consideration that the patient might be at risk for infection with the SARS-CoV-2 virus that causes COVID-19. Institutional protocols and algorithms that pertain to the evaluation of patients at risk for COVID-19 are in a state of rapid change based on information released by regulatory bodies including the CDC and federal and state organizations. These policies and algorithms were followed during the patient's care in the ED.   38 year old female referred from urgent care for epigastric pain secondary to pancreatitis; requires further work-up with imaging study. Differential diagnosis includes, but is not limited to, biliary disease (biliary colic, acute cholecystitis, cholangitis, choledocholithiasis, etc), intrathoracic causes for epigastric abdominal pain including ACS, gastritis, duodenitis, pancreatitis, small bowel or large bowel obstruction, abdominal aortic aneurysm, hernia, and ulcer(s).  We will repeat lab work, proceed with ultrasound; administer 50 mcg IV fentanyl for pain paired with 4 mg IV Zofran  for nausea.  Initiate IV fluid resuscitation.   Clinical Course as of May 31 244  Wed Jun 01, 2019  0243 Updated patient of laboratory and ultrasound results.  Pain is more tolerable after IV fentanyl.  Improvement in lipase is curious but I believe it is accurate as [place] urgent care uses the same laboratory as this facility.  Do not feel patient requires urgent MRCP given no evidence of choledocholithiasis on ultrasound.  Discussed with hospitalist Dr. Marcille Blanco to evaluate  patient in the emergency department for admission.   [JS]    Clinical Course User Index [JS] Paulette Blanch, MD     ____________________________________________   FINAL CLINICAL IMPRESSION(S) / ED DIAGNOSES  Final diagnoses:  Epigastric pain  Acute pancreatitis, unspecified complication status, unspecified pancreatitis type     ED Discharge Orders    None       Note:  This document was prepared using Dragon voice recognition software and may include unintentional dictation errors.   Paulette Blanch, MD 06/01/19 [phone]

## 2019-06-01 NOTE — Progress Notes (Signed)
[facility] - [place] at [facility]   PATIENT NAME: Stephanie Heath    MR#:  [id number]  DATE OF BIRTH:  [birth year]  SUBJECTIVE:  CHIEF COMPLAINT:   Chief Complaint  Patient presents with  . Diarrhea  . Abdominal Pain   No new complaint this morning.  Nausea vomiting improved.  Denies any diarrhea since yesterday.  No bloody bowel movements.  Started on clear liquid diet today.  REVIEW OF SYSTEMS:  Review of Systems  Constitutional: Negative for chills and fever.  HENT: Negative for hearing loss and tinnitus.   Eyes: Negative for blurred vision and double vision.  Respiratory: Negative for cough and shortness of breath.   Cardiovascular: Negative for chest pain and palpitations.  Gastrointestinal: Negative for heartburn.       Nausea and vomiting significantly improved.  No more diarrhea.  Genitourinary: Negative for dysuria and urgency.  Musculoskeletal: Negative for myalgias and neck pain.  Skin: Negative for itching and rash.  Neurological: Negative for dizziness and headaches.  Psychiatric/Behavioral: Negative for depression and hallucinations.    DRUG ALLERGIES:  No Known Allergies VITALS:  Blood pressure 100/71, pulse 69, temperature 98.2 F (36.8 C), temperature source Oral, resp. rate 15, height 4\' 11"  (1.499 m), weight 49.9 kg, last menstrual period 05/24/2019, SpO2 99 %. PHYSICAL EXAMINATION:   Physical Exam  Constitutional: She is oriented to person, place, and time. She appears well-developed.  HENT:  Head: Normocephalic and atraumatic.  Right Ear: External ear normal.  Eyes: Pupils are equal, round, and reactive to light. Conjunctivae are normal. Right eye exhibits no discharge.  Neck: Normal range of motion. Neck supple. No thyromegaly present.  Cardiovascular: Normal rate, regular rhythm and normal heart sounds.  Respiratory: Effort normal and breath sounds normal. No respiratory distress.  GI: Soft. Bowel sounds are normal. She exhibits  no distension.  Musculoskeletal: Normal range of motion.        General: No edema.  Neurological: She is alert and oriented to person, place, and time. No cranial nerve deficit.  Skin: Skin is warm. She is not diaphoretic. No erythema.  Psychiatric: She has a normal mood and affect. Her behavior is normal.   LABORATORY PANEL:  Female CBC Recent Labs  Lab 06/01/19 0117  WBC 11.4*  HGB 14.1  HCT 41.1  PLT 243   ------------------------------------------------------------------------------------------------------------------ Chemistries  Recent Labs  Lab 06/01/19 0117  NA 139  K 3.6  CL 102  CO2 28  GLUCOSE 111*  BUN 13  CREATININE 0.62  CALCIUM 8.9  AST 17  ALT 14  ALKPHOS 66  BILITOT 0.6   RADIOLOGY:  [place] Abdomen Limited Ruq  Result Date: 06/01/2019 CLINICAL DATA:  pancreatitis, evaluate for cholecystitis EXAM: ULTRASOUND ABDOMEN LIMITED RIGHT UPPER QUADRANT COMPARISON:  None. FINDINGS: Gallbladder: No gallstones or wall thickening visualized. No sonographic Murphy sign noted by sonographer. Common bile duct: Diameter: Normal caliber, 4 mm Liver: Small benign calcification. Normal echotexture. No suspicious mass or biliary duct dilatation. Portal vein is patent on color Doppler imaging with normal direction of blood flow towards the liver. IMPRESSION: No acute findings.  No evidence of cholelithiasis or cholecystitis. Electronically Signed   By: Charlett NoseKevin  Dover M.D.   On: 06/01/2019 02:18   ASSESSMENT AND PLAN:   1.  Acute pancreatitis Diagnosed clinically.  Lipase level significantly improved overnight.  Nausea and vomiting and abdominal pain; epigastric significantly improved. Diarrhea resolved.  Right upper quadrant ultrasound with no evidence of cholelithiasis or cholecystitis.  Lipid panel  in a.m. Patient started on clear liquid diet with plans to advance as tolerated.  Follow-up on gastroenterology evaluation and input IV fluid hydration.  2.  Nausea and vomiting:  Supportive care  DVT prophylaxis; Lovenox  All the records are reviewed and case discussed with Care Management/Social Worker. Management plans discussed with the patient, family and they are in agreement.  CODE STATUS: Full Code  TOTAL TIME TAKING CARE OF THIS PATIENT: 34 minutes.   More than 50% of the time was spent in counseling/coordination of care: YES  POSSIBLE D/C IN 2 DAYS, DEPENDING ON CLINICAL CONDITION.   Stephanie Heath M.D on 06/01/2019 at 12:23 PM  Between 7am to 6pm - Pager - [phone]  After 6pm go to [email or web address] - Proofreader  [facility]  Office  [phone]  CC: Primary care physician; System, Pcp Not In  Note: This dictation was prepared with Dragon dictation along with smaller phrase technology. Any transcriptional errors that result from this process are unintentional.

## 2019-06-01 NOTE — H&P (Signed)
Stephanie Heath is an 38 y.o. female.   Chief Complaint: Abdominal pain HPI: The patient with no chronic medical problems presents to the emergency department complaining of abdominal pain.  The patient reports that she has had left lower quadrant abdominal pain as well as epigastric pain for 3 days.  The pain has worsened in severity.  She had one episode of nonbloody nonbilious emesis.  She was seen at urgent care earlier today and found to have a lipase of 1360 which prompted her to seek evaluation in the emergency department.  Laboratory evaluation here showed a lipase of 127.  The patient's pain is not significantly improved.  Right upper quadrant ultrasound revealed no gallbladder disease or ductal dilatation.  However, due to ongoing abdominal pain unrelieved by multiple doses of analgesia the emergency department staff called the hospitalist service for admission.  History reviewed. No pertinent past medical history. No chronic medical illnesses  Past Surgical History:  Procedure Laterality Date  . NO PAST SURGERIES      Family History  Problem Relation Age of Onset  . Diabetes Mother   . Cancer Cousin 6832       thyroid   Social History:  reports that she has never smoked. She has never used smokeless tobacco. She reports that she does not drink alcohol or use drugs.  Allergies: No Known Allergies  Medications Prior to Admission  Medication Sig Dispense Refill  . HYDROcodone-acetaminophen (NORCO) 5-325 MG tablet Take 1 tablet by mouth every 8 (eight) hours as needed for moderate pain. 12 tablet 0  . loperamide (IMODIUM) 2 MG capsule Take 1 capsule (2 mg total) by mouth 4 (four) times daily as needed for diarrhea or loose stools. 12 capsule 0  . ondansetron (ZOFRAN) 4 MG tablet Take 1 tablet (4 mg total) by mouth every 6 (six) hours. 12 tablet 0    Results for orders placed or performed during the hospital encounter of 06/01/19 (from the past 48 hour(s))  CBC with Differential      Status: Abnormal   Collection Time: 06/01/19  1:17 AM  Result Value Ref Range   WBC 11.4 (H) 4.0 - 10.5 K/uL   RBC 4.78 3.87 - 5.11 MIL/uL   Hemoglobin 14.1 12.0 - 15.0 g/dL   HCT 95.641.1 21.336.0 - 08.646.0 %   MCV 86.0 80.0 - 100.0 fL   MCH 29.5 26.0 - 34.0 pg   MCHC 34.3 30.0 - 36.0 g/dL   RDW 57.812.6 46.911.5 - 62.915.5 %   Platelets 243 150 - 400 K/uL   nRBC 0.0 0.0 - 0.2 %   Neutrophils Relative % 72 %   Neutro Abs 8.2 (H) 1.7 - 7.7 K/uL   Lymphocytes Relative 16 %   Lymphs Abs 1.8 0.7 - 4.0 K/uL   Monocytes Relative 6 %   Monocytes Absolute 0.7 0.1 - 1.0 K/uL   Eosinophils Relative 6 %   Eosinophils Absolute 0.7 (H) 0.0 - 0.5 K/uL   Basophils Relative 0 %   Basophils Absolute 0.0 0.0 - 0.1 K/uL   Immature Granulocytes 0 %   Abs Immature Granulocytes 0.03 0.00 - 0.07 K/uL    Comment: Performed at [facility], [address] [place], [place] [address]  Comprehensive metabolic panel     Status: Abnormal   Collection Time: 06/01/19  1:17 AM  Result Value Ref Range   Sodium 139 135 - 145 mmol/L   Potassium 3.6 3.5 - 5.1 mmol/L   Chloride 102 98 - 111  mmol/L   CO2 28 22 - 32 mmol/L   Glucose, Bld 111 (H) 70 - 99 mg/dL   BUN 13 6 - 20 mg/dL   Creatinine, Ser 4.540.62 0.44 - 1.00 mg/dL   Calcium 8.9 8.9 - 09.810.3 mg/dL   Total Protein 7.1 6.5 - 8.1 g/dL   Albumin 4.0 3.5 - 5.0 g/dL   AST 17 15 - 41 U/L   ALT 14 0 - 44 U/L   Alkaline Phosphatase 66 38 - 126 U/L   Total Bilirubin 0.6 0.3 - 1.2 mg/dL   GFR calc non Af Amer >60 >60 mL/min   GFR calc Af Amer >60 >60 mL/min   Anion gap 9 5 - 15    Comment: Performed at [facility], [address] [place], [place] [address]  Lipase, blood     Status: Abnormal   Collection Time: 06/01/19  1:17 AM  Result Value Ref Range   Lipase 127 (H) 11 - 51 U/L    Comment: Performed at [facility], [address] [place], [place] [address]  SARS Coronavirus 2 (CEPHEID - Performed in [facility] hospital lab), Hosp Order      Status: None   Collection Time: 06/01/19  1:17 AM   Specimen: Nasopharyngeal Swab  Result Value Ref Range   SARS Coronavirus 2 NEGATIVE NEGATIVE    Comment: (NOTE) If result is NEGATIVE SARS-CoV-2 target nucleic acids are NOT DETECTED. The SARS-CoV-2 RNA is generally detectable in upper and lower  respiratory specimens during the acute phase of infection. The lowest  concentration of SARS-CoV-2 viral copies this assay can detect is 250  copies / mL. A negative result does not preclude SARS-CoV-2 infection  and should not be used as the sole basis for treatment or other  patient management decisions.  A negative result may occur with  improper specimen collection / handling, submission of specimen other  than nasopharyngeal swab, presence of viral mutation(s) within the  areas targeted by this assay, and inadequate number of viral copies  (<250 copies / mL). A negative result must be combined with clinical  observations, patient history, and epidemiological information. If result is POSITIVE SARS-CoV-2 target nucleic acids are DETECTED. The SARS-CoV-2 RNA is generally detectable in upper and lower  respiratory specimens dur ing the acute phase of infection.  Positive  results are indicative of active infection with SARS-CoV-2.  Clinical  correlation with patient history and other diagnostic information is  necessary to determine patient infection status.  Positive results do  not rule out bacterial infection or co-infection with other viruses. If result is PRESUMPTIVE POSTIVE SARS-CoV-2 nucleic acids MAY BE PRESENT.   A presumptive positive result was obtained on the submitted specimen  and confirmed on repeat testing.  While 2019 novel coronavirus  (SARS-CoV-2) nucleic acids may be present in the submitted sample  additional confirmatory testing may be necessary for epidemiological  and / or clinical management purposes  to differentiate between  SARS-CoV-2 and other Sarbecovirus  currently known to infect humans.  If clinically indicated additional testing with an alternate test  methodology [id number]) is advised. The SARS-CoV-2 RNA is generally  detectable in upper and lower respiratory sp ecimens during the acute  phase of infection. The expected result is Negative. Fact Sheet for Patients:  BoilerBrush.com.cyhttps://www.fda.gov/media/136312/download Fact Sheet for Healthcare Providers: https://pope.com/https://www.fda.gov/media/136313/download This test is not yet approved or cleared by the [place] FDA and has been authorized for detection and/or diagnosis of SARS-CoV-2 by FDA under an  Emergency Use Authorization (EUA).  This EUA will remain in effect (meaning this test can be used) for the duration of the COVID-19 declaration under Section 564(b)(1) of the Act, 21 U.S.C. section 360bbb-3(b)(1), unless the authorization is terminated or revoked sooner. Performed at [facility], [address] [place], [place] [address]    [place] Abdomen Limited Ruq  Result Date: 06/01/2019 CLINICAL DATA:  pancreatitis, evaluate for cholecystitis EXAM: ULTRASOUND ABDOMEN LIMITED RIGHT UPPER QUADRANT COMPARISON:  None. FINDINGS: Gallbladder: No gallstones or wall thickening visualized. No sonographic Murphy sign noted by sonographer. Common bile duct: Diameter: Normal caliber, 4 mm Liver: Small benign calcification. Normal echotexture. No suspicious mass or biliary duct dilatation. Portal vein is patent on color Doppler imaging with normal direction of blood flow towards the liver. IMPRESSION: No acute findings.  No evidence of cholelithiasis or cholecystitis. Electronically Signed   By: Charlett NoseKevin  Dover M.D.   On: 06/01/2019 02:18    Review of Systems  Constitutional: Negative for chills and fever.  HENT: Negative for sore throat and tinnitus.   Eyes: Negative for blurred vision and redness.  Respiratory: Negative for cough and shortness of breath.   Cardiovascular: Negative for chest pain,  palpitations, orthopnea and PND.  Gastrointestinal: Positive for abdominal pain, nausea and vomiting. Negative for diarrhea.  Genitourinary: Negative for dysuria, frequency and urgency.  Musculoskeletal: Negative for joint pain and myalgias.  Skin: Negative for rash.       No lesions  Neurological: Negative for speech change, focal weakness and weakness.  Endo/Heme/Allergies: Does not bruise/bleed easily.       No temperature intolerance  Psychiatric/Behavioral: Negative for depression and suicidal ideas.    Blood pressure 100/77, pulse 64, temperature (!) 97.5 F (36.4 C), temperature source Oral, resp. rate 18, height 4\' 11"  (1.499 m), weight 49.9 kg, last menstrual period 05/24/2019, SpO2 100 %. Physical Exam  Vitals reviewed. Constitutional: She is oriented to person, place, and time. She appears well-developed and well-nourished. No distress.  HENT:  Head: Normocephalic and atraumatic.  Mouth/Throat: Oropharynx is clear and moist.  Eyes: Pupils are equal, round, and reactive to light. Conjunctivae and EOM are normal. No scleral icterus.  Neck: Normal range of motion. Neck supple. No JVD present. No tracheal deviation present. No thyromegaly present.  Cardiovascular: Normal rate, regular rhythm and normal heart sounds. Exam reveals no gallop and no friction rub.  No murmur heard. Respiratory: Effort normal and breath sounds normal.  GI: Soft. Bowel sounds are normal. She exhibits no distension and no mass. There is abdominal tenderness. There is no rebound and no guarding.  Genitourinary:    Genitourinary Comments: Deferred   Musculoskeletal: Normal range of motion.        General: No edema.  Lymphadenopathy:    She has no cervical adenopathy.  Neurological: She is alert and oriented to person, place, and time. No cranial nerve deficit. She exhibits normal muscle tone.  Skin: Skin is warm and dry. No rash noted. No erythema.  Psychiatric: She has a normal mood and affect. Her  behavior is normal. Judgment and thought content normal.     Assessment/Plan This is a [age] year old female admitted for intractable abdominal pain. 1.  Abdominal pain: Intractable; IV morphine as needed for severe pain.  The patient does have some diarrhea at this time although it is not persistent.  If the patient continues to have loose stools or multiple episodes of unformed stool consider evaluation for C. difficile or ova and parasites.  If  pain does not improve consider CT abdomen. 2.  Elevated lipase: Dramatic improvement from earlier labs performed by affiliate laboratory.  The patient possibly had a gallstone causing pancreatitis or alternatively, the lab value is erroneous.  Consult gastroenterology for further guidance.  Keep patient n.p.o. and hydrate with intravenous fluid. 3.  Nausea and vomiting: Supportive care 4.  DVT prophylaxis: Lovenox 5.  GI prophylaxis: None The patient is a full code.  Time spent on admission orders and patient care approximately 45 minutes  Harrie Foreman, MD 06/01/2019, 4:54 AM

## 2019-06-02 ENCOUNTER — Observation Stay: Payer: BC Managed Care – PPO

## 2019-06-02 LAB — HEPATIC FUNCTION PANEL
ALT: 12 U/L (ref 0–44)
AST: 14 U/L — ABNORMAL LOW (ref 15–41)
Albumin: 3.2 g/dL — ABNORMAL LOW (ref 3.5–5.0)
Alkaline Phosphatase: 65 U/L (ref 38–126)
Bilirubin, Direct: 0.1 mg/dL (ref 0.0–0.2)
Indirect Bilirubin: 0.5 mg/dL (ref 0.3–0.9)
Total Bilirubin: 0.6 mg/dL (ref 0.3–1.2)
Total Protein: 6.3 g/dL — ABNORMAL LOW (ref 6.5–8.1)

## 2019-06-02 LAB — BASIC METABOLIC PANEL
Anion gap: 6 (ref 5–15)
BUN: 6 mg/dL (ref 6–20)
CO2: 25 mmol/L (ref 22–32)
Calcium: 8.4 mg/dL — ABNORMAL LOW (ref 8.9–10.3)
Chloride: 109 mmol/L (ref 98–111)
Creatinine, Ser: 0.65 mg/dL (ref 0.44–1.00)
GFR calc Af Amer: >60 mL/min (ref >60)
GFR calc non Af Amer: >60 mL/min (ref >60)
Glucose, Bld: 93 mg/dL (ref 70–99)
Potassium: 3.5 mmol/L (ref 3.5–5.1)
Sodium: 140 mmol/L (ref 135–145)

## 2019-06-02 LAB — C DIFFICILE QUICK SCREEN W PCR REFLEX
C Diff antigen: NEGATIVE
C Diff interpretation: NOT DETECTED
C Diff toxin: NEGATIVE

## 2019-06-02 LAB — MAGNESIUM: Magnesium: 1.8 mg/dL (ref 1.7–2.4)

## 2019-06-02 LAB — LIPID PANEL
Cholesterol: 159 mg/dL (ref 0–200)
HDL: 41 mg/dL (ref >40)
LDL Cholesterol: 108 mg/dL — ABNORMAL HIGH (ref 0–99)
Total CHOL/HDL Ratio: 3.9 RATIO
Triglycerides: 48 mg/dL (ref <150)
VLDL: 10 mg/dL (ref 0–40)

## 2019-06-02 MED ORDER — SIMETHICONE 80 MG PO CHEW
80.0000 mg | CHEWABLE_TABLET | Freq: Four times a day (QID) | ORAL | Status: DC | PRN
  Filled 2019-06-02 (×2): qty 1

## 2019-06-02 MED ORDER — IOHEXOL 240 MG/ML SOLN
25.0000 mL | INTRAMUSCULAR | Status: AC
  Administered 2019-06-02 (×2): 25 mL via ORAL

## 2019-06-02 MED ORDER — LOPERAMIDE HCL 2 MG PO CAPS: 2.0000 mg | ORAL_CAPSULE | ORAL | Status: DC | PRN

## 2019-06-02 NOTE — Progress Notes (Signed)
[facility] - [place] at [facility]   PATIENT NAME: Stephanie Heath    MR#:  [id number]  DATE OF BIRTH:  04-02-1981  SUBJECTIVE:  CHIEF COMPLAINT:   Chief Complaint  Patient presents with  . Diarrhea  . Abdominal Pain   No new complaint this morning.  Denies any nausea or vomiting or abdominal pains.  Patient however reported having diarrhea last night with at least 5 loose stools.  Requested for some possible C. difficile.   REVIEW OF SYSTEMS:  Review of Systems  Constitutional: Negative for chills and fever.  HENT: Negative for hearing loss and tinnitus.   Eyes: Negative for blurred vision and double vision.  Respiratory: Negative for cough and shortness of breath.   Cardiovascular: Negative for chest pain and palpitations.  Gastrointestinal: Negative for heartburn.       Nausea and vomiting significantly improved.  No more diarrhea.  Genitourinary: Negative for dysuria and urgency.  Musculoskeletal: Negative for myalgias and neck pain.  Skin: Negative for itching and rash.  Neurological: Negative for dizziness and headaches.  Psychiatric/Behavioral: Negative for depression and hallucinations.    DRUG ALLERGIES:  No Known Allergies VITALS:  Blood pressure 105/75, pulse 73, temperature 98.2 F (36.8 C), temperature source Oral, resp. rate 16, height 4\' 11"  (1.499 m), weight 50.2 kg, last menstrual period 05/24/2019, SpO2 100 %. PHYSICAL EXAMINATION:   Physical Exam  Constitutional: She is oriented to person, place, and time. She appears well-developed.  HENT:  Head: Normocephalic and atraumatic.  Right Ear: External ear normal.  Eyes: Pupils are equal, round, and reactive to light. Conjunctivae are normal. Right eye exhibits no discharge.  Neck: Normal range of motion. Neck supple. No thyromegaly present.  Cardiovascular: Normal rate, regular rhythm and normal heart sounds.  Respiratory: Effort normal and breath sounds normal. No respiratory distress.   GI: Soft. Bowel sounds are normal. She exhibits no distension.  Musculoskeletal: Normal range of motion.        General: No edema.  Neurological: She is alert and oriented to person, place, and time. No cranial nerve deficit.  Skin: Skin is warm. She is not diaphoretic. No erythema.  Psychiatric: She has a normal mood and affect. Her behavior is normal.   LABORATORY PANEL:  Female CBC Recent Labs  Lab 06/01/19 0117  WBC 11.4*  HGB 14.1  HCT 41.1  PLT 243   ------------------------------------------------------------------------------------------------------------------ Chemistries  Recent Labs  Lab 06/02/19 0352  NA 140  K 3.5  CL 109  CO2 25  GLUCOSE 93  BUN 6  CREATININE 0.65  CALCIUM 8.4*  MG 1.8  AST 14*  ALT 12  ALKPHOS 65  BILITOT 0.6   RADIOLOGY:  No results found. ASSESSMENT AND PLAN:   1.  Acute pancreatitis Diagnosed clinically.  Lipase level significantly improved overnight.  Nausea and vomiting and abdominal pain; epigastric significantly improved. Diarrhea resolved.  Right upper quadrant ultrasound with no evidence of cholelithiasis or cholecystitis.  Lipid panel done with triglyceride level of 48. Diet advanced to soft diet today. Seen by gastroenterologist.  Appreciate input.  No further work-up recommended.  2.  Nausea and vomiting: Supportive care  3.  Diarrhea Had 5 loose stools overnight.  Requested for samples for C. difficile before initiating any antimotility agent. IV fluid hydration  DVT prophylaxis; Lovenox  All the records are reviewed and case discussed with Care Management/Social Worker. Management plans discussed with the patient, family and they are in agreement.  CODE STATUS: Full Code  TOTAL TIME TAKING CARE OF THIS PATIENT: 36 minutes.   More than 50% of the time was spent in counseling/coordination of care: YES  POSSIBLE D/C IN 1-2 DAYS, DEPENDING ON CLINICAL CONDITION.   Perina Salvaggio M.D on 06/02/2019 at 1:02 PM   Between 7am to 6pm - Pager - [phone]  After 6pm go to [email or web address] - Proofreader  [facility] [facility]  Office  [phone]  CC: Primary care physician; System, Pcp Not In  Note: This dictation was prepared with Dragon dictation along with smaller phrase technology. Any transcriptional errors that result from this process are unintentional.

## 2019-06-02 NOTE — Progress Notes (Addendum)
GI Inpatient Follow-up Note  Subjective:  Patient seen in f/u for likely idiopathic acute pancreatitis. No acute events overnight. She denies any episodes of nausea or vomiting. There is no worsening abdominal pain. She does report she had 5 episodes of diarrhea last night. Lipid panel done this morning showed triglyceride level of 48. Her diet was advanced to soft diet and she is tolerating this without any difficulty. She had some mashed potatoes and chicken breast today for lunch. She has had two loose BMs this afternoon - watery and yellow in color. She remains on IV fluids. As noted in yesterdays consult note, she reports she had some lasagna on Monday evening with ground beef from [employer]. Her husband at home reportedly has had diarrhea over the past two nights.   Scheduled Inpatient Medications:  . enoxaparin (LOVENOX) injection  40 mg Subcutaneous Q24H    Continuous Inpatient Infusions:   . sodium chloride 100 mL/hr at 06/02/19 1210    PRN Inpatient Medications:  acetaminophen **OR** acetaminophen, HYDROcodone-acetaminophen, morphine injection, ondansetron **OR** ondansetron (ZOFRAN) IV  Review of Systems: Constitutional: Weight is stable.  Eyes: No changes in vision. ENT: No oral lesions, sore throat.  GI: see HPI.  Heme/Lymph: No easy bruising.  CV: No chest pain.  GU: No hematuria.  Integumentary: No rashes.  Neuro: No headaches.  Psych: No depression/anxiety.  Endocrine: No heat/cold intolerance.  Allergic/Immunologic: No urticaria.  Resp: No cough, SOB.  Musculoskeletal: No joint swelling.    Physical Examination: BP 105/75   Pulse 73   Temp 98.2 F (36.8 C) (Oral)   Resp 16   Ht 4\' 11"  (1.499 m)   Wt 50.2 kg   LMP 05/24/2019   SpO2 100%   BMI 22.35 kg/m  Gen: NAD, alert and oriented x 4 HEENT: PEERLA, EOMI, Neck: supple, no JVD or thyromegaly Chest: CTA bilaterally, no wheezes, crackles, or other adventitious sounds CV: RRR, no m/g/c/r Abd: soft,  nondistended, very minimal tenderness to deep palpation in epigastric region, +BS in all four quadrants; no HSM, guarding, ridigity, or rebound tenderness Ext: no edema, well perfused with 2+ pulses, Skin: no rash or lesions noted Lymph: no LAD  Data: Lab Results  Component Value Date   WBC 11.4 (H) 06/01/2019   HGB 14.1 06/01/2019   HCT 41.1 06/01/2019   MCV 86.0 06/01/2019   PLT 243 06/01/2019   Recent Labs  Lab 05/31/19 1725 06/01/19 0117  HGB 15.2* 14.1   Lab Results  Component Value Date   NA 140 06/02/2019   K 3.5 06/02/2019   CL 109 06/02/2019   CO2 25 06/02/2019   BUN 6 06/02/2019   CREATININE 0.65 06/02/2019   Lab Results  Component Value Date   ALT 12 06/02/2019   AST 14 (L) 06/02/2019   ALKPHOS 65 06/02/2019   BILITOT 0.6 06/02/2019   No results for input(s): APTT, INR, PTT in the last 168 hours. Assessment/Plan:  38 y/o Hispanic female with no significant PMH admitted for idiopathic acute pancreatitis  1. Idiopathic acute pancreatitis 2. 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 Given she has a history of eating some ground beef on recall  and husband symptomatic at home with diarrhea, we will check further stool studies with GI PCR and stool culture - We will continue to monitor. Consider anti-motility agent if stool studies are negative. - Continue IVF hydration  - No further work-up indicated at this time - Continue to advance diet as tolerated - F/U with GI in the interim as needed    Please call with questions or concerns.    Jacob MooresMason Ambika Zettlemoyer, PA-C [facility]  Gastroenterology [phone] (Cell)

## 2019-06-02 NOTE — Progress Notes (Signed)
Pt CT scan completed. Primary nurse paged and spoke to NP [facility] and results from CT  read back. No new orders. Primary nurse to continue to monitor.

## 2019-06-03 ENCOUNTER — Inpatient Hospital Stay: Payer: BC Managed Care – PPO

## 2019-06-03 DIAGNOSIS — Z1159 Encounter for screening for other viral diseases: Secondary | ICD-10-CM | POA: Diagnosis not present

## 2019-06-03 DIAGNOSIS — K5289 Other specified noninfective gastroenteritis and colitis: Secondary | ICD-10-CM | POA: Diagnosis present

## 2019-06-03 DIAGNOSIS — Z833 Family history of diabetes mellitus: Secondary | ICD-10-CM | POA: Diagnosis not present

## 2019-06-03 DIAGNOSIS — R1013 Epigastric pain: Secondary | ICD-10-CM | POA: Diagnosis present

## 2019-06-03 DIAGNOSIS — Z79899 Other long term (current) drug therapy: Secondary | ICD-10-CM | POA: Diagnosis not present

## 2019-06-03 DIAGNOSIS — E860 Dehydration: Secondary | ICD-10-CM | POA: Diagnosis not present

## 2019-06-03 DIAGNOSIS — K8500 Idiopathic acute pancreatitis without necrosis or infection: Secondary | ICD-10-CM | POA: Diagnosis present

## 2019-06-03 DIAGNOSIS — Z79891 Long term (current) use of opiate analgesic: Secondary | ICD-10-CM | POA: Diagnosis not present

## 2019-06-03 DIAGNOSIS — N7091 Salpingitis, unspecified: Secondary | ICD-10-CM | POA: Diagnosis present

## 2019-06-03 DIAGNOSIS — N200 Calculus of kidney: Secondary | ICD-10-CM | POA: Diagnosis present

## 2019-06-03 LAB — BASIC METABOLIC PANEL
Anion gap: 6 (ref 5–15)
BUN: 11 mg/dL (ref 6–20)
CO2: 25 mmol/L (ref 22–32)
Calcium: 8.7 mg/dL — ABNORMAL LOW (ref 8.9–10.3)
Chloride: 108 mmol/L (ref 98–111)
Creatinine, Ser: 0.66 mg/dL (ref 0.44–1.00)
GFR calc Af Amer: >60 mL/min (ref >60)
GFR calc non Af Amer: >60 mL/min (ref >60)
Glucose, Bld: 99 mg/dL (ref 70–99)
Potassium: 3.6 mmol/L (ref 3.5–5.1)
Sodium: 139 mmol/L (ref 135–145)

## 2019-06-03 LAB — GASTROINTESTINAL PANEL BY PCR, STOOL (REPLACES STOOL CULTURE)

## 2019-06-03 LAB — MAGNESIUM: Magnesium: 1.8 mg/dL (ref 1.7–2.4)

## 2019-06-03 MED ORDER — LOPERAMIDE HCL 2 MG PO CAPS: 2.0000 mg | ORAL_CAPSULE | ORAL | Status: DC | PRN

## 2019-06-03 NOTE — Progress Notes (Signed)
GI Inpatient Follow-up Note  Subjective:  Patient seen and examined this afternoon resting comfortably in hospital bed. 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 She had subsequent transvaginal ultrasound showing complex adnexal masses noted bilaterally, suggestive of hydro/pyosalpinx with no free pelvic fluid collections.   Patient reports five loose stools this morning between 9AM -11AM that were nonbloody. She does report some associated bilateral lower abd cramping preceding BMs that is relieved upon defecation. She denies any new GI complaints. She is tolerating soft diet with no difficulties. She denies fever, nausea, vomiting, abdominal pain, rectal bleeding. She reports she last followed with an OBGYN appx 1 year ago.   Scheduled Inpatient Medications:  . enoxaparin (LOVENOX) injection  40 mg Subcutaneous Q24H    Continuous Inpatient Infusions:   . sodium chloride 100 mL/hr at 06/03/19 0627    PRN Inpatient Medications:  acetaminophen **OR** acetaminophen, HYDROcodone-acetaminophen, loperamide, morphine injection, ondansetron **OR** ondansetron (ZOFRAN) IV, simethicone  Review of Systems: Constitutional: Weight is stable.  Eyes: No changes in vision. ENT: No oral lesions, sore throat.  GI: see HPI.  Heme/Lymph: No easy bruising.  CV: No chest pain.  GU: No hematuria.  Integumentary: No rashes.  Neuro: No headaches.  Psych: No depression/anxiety.  Endocrine: No heat/cold intolerance.  Allergic/Immunologic: No urticaria.  Resp: No cough, SOB.  Musculoskeletal: No joint swelling.    Physical Examination: BP 105/71   Pulse 77   Temp 98.9 F (37.2 C) (Oral)   Resp 20   Ht 4\' 11"  (1.499 m)   Wt 50.2 kg   LMP 05/24/2019   SpO2 99%   BMI 22.35 kg/m  Gen: NAD, alert and oriented x  4 HEENT: PEERLA, EOMI, Neck: supple, no JVD or thyromegaly Chest: CTA bilaterally, no wheezes, crackles, or other adventitious sounds CV: RRR, no m/g/c/r Abd: soft, NT, ND, +BS in all four quadrants; no HSM, guarding, ridigity, or rebound tenderness Ext: no edema, well perfused with 2+ pulses, Skin: no rash or lesions noted Lymph: no LAD  Data: Lab Results  Component Value Date   WBC 11.4 (H) 06/01/2019   HGB 14.1 06/01/2019   HCT 41.1 06/01/2019   MCV 86.0 06/01/2019   PLT 243 06/01/2019   Recent Labs  Lab 05/31/19 1725 06/01/19 0117  HGB 15.2* 14.1   Lab Results  Component Value Date   NA 139 06/03/2019   K 3.6 06/03/2019   CL 108 06/03/2019   CO2 25 06/03/2019   BUN 11 06/03/2019   CREATININE 0.66 06/03/2019   Lab Results  Component Value Date   ALT 12 06/02/2019   AST 14 (L) 06/02/2019   ALKPHOS 65 06/02/2019   BILITOT 0.6 06/02/2019   No results for input(s): APTT, INR, PTT in the last 168 hours. Assessment/Plan: 38 y/o Hispanic female with no significant PMH admitted for idiopathic acute pancreatitis  1. Idiopathic acute pancreatitis - Stable, no signs and symptoms of ongoing pancreatitis. Continue to adv diet as tolerated 2. Loose stools   3. Complex adnexal masses - likely hydro/pyosalpinx  - GI PCR and c diff are negative - Likely resolving gastroenteritis vs post-infectious IBS. Less likely inflammatory given her history. If persists, further w/u as indicated - CT scan and TVUS findings discussed with patient - Advise she f/u with OBGYN as outpatient given these complex adnexal masses findings - She may use  Imodium 2mg  PRN for diarrhea - Encouraged good fluid hydration with electrolytes  - Continue symptomatic management  GI will sign off at this time. Please call us back with any further questions or concerns.     Octavia Bruckner, PA-C [facility] [phone] (Cell)

## 2019-06-03 NOTE — Progress Notes (Signed)
[facility] [place] NAME: Stephanie Heath    MR#:  [id number]  DATE OF BIRTH:  1981/06/18  SUBJECTIVE:  CHIEF COMPLAINT:   Chief Complaint  Patient presents with   Diarrhea   Abdominal Pain   Patient still having significant diarrhea overnight as well as this morning.  No fevers. No abdominal pains.  REVIEW OF SYSTEMS:  Review of Systems  Constitutional: Negative for chills and fever.  HENT: Negative for hearing loss and tinnitus.   Eyes: Negative for blurred vision and double vision.  Respiratory: Negative for cough and shortness of breath.   Cardiovascular: Negative for chest pain and palpitations.  Gastrointestinal: Negative for heartburn.       Nausea and vomiting significantly improved.  No more diarrhea.  Genitourinary: Negative for dysuria and urgency.  Musculoskeletal: Negative for myalgias and neck pain.  Skin: Negative for itching and rash.  Neurological: Negative for dizziness and headaches.  Psychiatric/Behavioral: Negative for depression and hallucinations.    DRUG ALLERGIES:  No Known Allergies VITALS:  Blood pressure 105/71, pulse 77, temperature 98.9 F (37.2 C), temperature source Oral, resp. rate 20, height 4\' 11"  (1.499 m), weight 50.2 kg, last menstrual period 05/24/2019, SpO2 99 %. PHYSICAL EXAMINATION:   Physical Exam  Constitutional: She is oriented to person, place, and time. She appears well-developed.  HENT:  Head: Normocephalic and atraumatic.  Right Ear: External ear normal.  Eyes: Pupils are equal, round, and reactive to light. Conjunctivae are normal. Right eye exhibits no discharge.  Neck: Normal range of motion. Neck supple. No thyromegaly present.  Cardiovascular: Normal rate, regular rhythm and normal heart sounds.  Respiratory: Effort normal and breath sounds normal. No respiratory distress.  GI: Soft. Bowel sounds are normal. She exhibits no distension.  Musculoskeletal: Normal range of  motion.        General: No edema.  Neurological: She is alert and oriented to person, place, and time. No cranial nerve deficit.  Skin: Skin is warm. She is not diaphoretic. No erythema.  Psychiatric: She has a normal mood and affect. Her behavior is normal.   LABORATORY PANEL:  Female CBC Recent Labs  Lab 06/01/19 0117  WBC 11.4*  HGB 14.1  HCT 41.1  PLT 243   ------------------------------------------------------------------------------------------------------------------ Chemistries  Recent Labs  Lab 06/02/19 0352 06/03/19 0957  NA 140 139  K 3.5 3.6  CL 109 108  CO2 25 25  GLUCOSE 93 99  BUN 6 11  CREATININE 0.65 0.66  CALCIUM 8.4* 8.7*  MG 1.8 1.8  AST 14*  --   ALT 12  --   ALKPHOS 65  --   BILITOT 0.6  --    RADIOLOGY:  Ct Abdomen Pelvis Wo Contrast  Result Date: 06/02/2019 CLINICAL DATA:  Back pain, cancer or infection suspected. Patient reports upper abdominal pain, nausea and vomiting for 3 days. Diarrhea. EXAM: CT ABDOMEN AND PELVIS WITHOUT CONTRAST TECHNIQUE: Multidetector CT imaging of the abdomen and pelvis was performed following the standard protocol without IV contrast. COMPARISON:  None. FINDINGS: Lower chest: Lung bases are clear. Hepatobiliary: No focal abnormality allowing for lack of contrast. Gallbladder physiologically distended, no calcified stone. No biliary dilatation. Pancreas: No ductal dilatation or inflammation. No peripancreatic fluid collection. Spleen: Normal in size without focal abnormality. Adrenals/Urinary Tract: Normal adrenal glands. 5 mm calcification in the upper right kidney may be parenchymal or nonobstructing stone, there is adjacent cortical scarring. Slight contour irregularity of the uppermost pole of the  right kidney which may be due to underlying cysts or scarring, not well-defined in the absence of IV contrast. No hydronephrosis. No perinephric edema. Tiny cortical cyst in the mid left kidney. Urinary bladder is physiologically  distended. No bladder wall thickening. Stomach/Bowel: Stomach is within normal limits. Appendix appears normal. No evidence of bowel wall thickening, distention, or inflammatory changes. Administered enteric contrast reaches the [place]. Vascular/Lymphatic: Noncontrast vascular structures are unremarkable. No bulky abdominopelvic adenopathy. Reproductive: Tubular cystic structure in both adnexa suspicious for hydro or pyosalpinx, better defined on the right. No significant pelvic fat stranding or inflammation. The unenhanced uterus is unremarkable. Other: No free fluid or free air. Tiny fat containing umbilical hernia. Musculoskeletal: Dysplastic left acetabulum and femoral head. Proximal femur is dislocated superiorly with aplastic appearance to the femoral head and questionable pseudoarticulation of the lateral aspect is the iliac crest. Left ileus psoas muscle atrophy compared to right. No acute osseous abnormality. IMPRESSION: 1. Tubular cystic structures in both adnexa suspicious for hydro or pyosalpinx. No associated pelvic inflammation or fat stranding to suggest acute inflammation, favoring chronicity. This could be further characterized with pelvic ultrasound. 2. Nonobstructing stone versus cortical calcification in the right upper kidney with adjacent cortical scarring. 3. 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 Electronically Signed   By: Narda RutherfordMelanie  Sanford M.D.   On: 06/02/2019 22:22   [place] Pelvic Complete With Transvaginal  Result Date: 06/03/2019 CLINICAL DATA:  Possible hydrosalpinx/pyosalpinx on CT. EXAM: TRANSABDOMINAL AND TRANSVAGINAL ULTRASOUND OF PELVIS TECHNIQUE: Both transabdominal and transvaginal ultrasound examinations of the pelvis were performed. Transabdominal technique was performed for global imaging of the pelvis including uterus, ovaries, adnexal regions, and pelvic cul-de-sac. It was necessary to proceed  with endovaginal exam following the transabdominal exam to visualize the uterus and ovaries. COMPARISON:  06/02/2019. FINDINGS: Uterus Measurements: 9.4 x 4.8 x 4.4 cm = volume: 103 mL. No fibroids or other mass visualized. Endometrium Thickness: 6.0.  No focal abnormality visualized. Right ovary Measurements: 3.6 x 2.6 x 2.5 cm = volume: 12.1 mL. Ovary difficult visualized. Adjacent complex adnexal mass noted. Left ovary Measurements: 2.2 x 1.6 x 1.4 cm = volume: 2.6 mL. Ovary difficult to visualize. Adjacent complex adnexal mass noted. Other findings No abnormal free fluid. IMPRESSION: Complex adnexal masses are noted bilaterally. These findings may represent hydro/pyosalpinx, particularly given previously identified similar findings on CT. Underlying tumor cannot be excluded. A process such as ectopic pregnancy cannot be excluded. The ovaries are difficult to visualize. Follow-up exam can be obtained as needed. No free pelvic fluid collections are identified. . Electronically Signed   By: Maisie Fushomas  Register   On: 06/03/2019 11:50   ASSESSMENT AND PLAN:   1.  Idiopathic acute pancreatitis Diagnosed clinically.  Lipase level significantly improved.  Nausea and vomiting and abdominal pain; epigastric significantly improved. Right upper quadrant ultrasound with no evidence of cholelithiasis or cholecystitis.  Lipid panel done with triglyceride level of 48. Diet advanced to soft diet today. Seen by gastroenterologist.  Appreciate input.   2.  Persistent diarrhea There was concern for possible bacterial colitis yesterday.  CT scan of the abdomen and pelvis done which revealed complex adnexal masses likely hydro-/pyosalpinx.  Pelvic ultrasound done for further evaluation which revealed complex adnexal masses bilaterally.  Underlying tumor cannot be excluded.  Pregnancy test was negative Recommendation is to follow-up with OB/GYN as outpatient for the complex adnexal masses. Started on PRN Imodium since his  CD4 and GI  panel negative.  3.  Dehydration Secondary to ongoing diarrhea requiring IV fluid hydration Change to inpatient Possible discharge tomorrow if diarrhea improves with Imodium.  DVT prophylaxis; Lovenox  All the records are reviewed and case discussed with Care Management/Social Worker. Management plans discussed with the patient, family and they are in agreement. Called patient's husband Mr. Hector for update.  No response.    CODE STATUS: Full Code  TOTAL TIME TAKING CARE OF THIS PATIENT: 34 minutes.   More than 50% of the time was spent in counseling/coordination of care: YES  POSSIBLE D/C IN 1-2 DAYS, DEPENDING ON CLINICAL CONDITION.   Colette Dicamillo M.D on 06/03/2019 at 2:47 PM  Between 7am to 6pm - Pager - [phone]  After 6pm go to [email or web address] - Social research officer, [email or web address] [email or web address]  [employer] [facility] [facility]  Office  [phone]  CC: Primary care physician; System, Pcp Not In  Note: This dictation was prepared with Dragon dictation along with smaller phrase technology. Any transcriptional errors that result from this process are unintentional.

## 2019-06-04 LAB — BASIC METABOLIC PANEL
Anion gap: 6 (ref 5–15)
BUN: 13 mg/dL (ref 6–20)
CO2: 23 mmol/L (ref 22–32)
Calcium: 8.4 mg/dL — ABNORMAL LOW (ref 8.9–10.3)
Chloride: 110 mmol/L (ref 98–111)
Creatinine, Ser: 0.5 mg/dL (ref 0.44–1.00)
GFR calc Af Amer: >60 mL/min (ref >60)
GFR calc non Af Amer: >60 mL/min (ref >60)
Glucose, Bld: 90 mg/dL (ref 70–99)
Potassium: 3.5 mmol/L (ref 3.5–5.1)
Sodium: 139 mmol/L (ref 135–145)

## 2019-06-04 LAB — CBC
HCT: 40.8 % (ref 36.0–46.0)
Hemoglobin: 14 g/dL (ref 12.0–15.0)
MCH: 29.4 pg (ref 26.0–34.0)
MCHC: 34.3 g/dL (ref 30.0–36.0)
MCV: 85.5 fL (ref 80.0–100.0)
Platelets: 207 10*3/uL (ref 150–400)
RBC: 4.77 MIL/uL (ref 3.87–5.11)
RDW: 12.2 % (ref 11.5–15.5)
WBC: 7.1 10*3/uL (ref 4.0–10.5)
nRBC: 0 % (ref 0.0–0.2)

## 2019-06-04 LAB — MAGNESIUM: Magnesium: 2.1 mg/dL (ref 1.7–2.4)

## 2019-06-04 NOTE — Progress Notes (Signed)
MD ordered patient to be discharged home.  Discharge instructions were reviewed with the patient and she voiced understanding.  Patient instructed on making her follow-up appointment.  IV was removed with catheter intact.  All patients questions were answered.

## 2019-06-04 NOTE — Discharge Summary (Signed)
[employer] [facility] [place] at [facility]   PATIENT NAME: Malachi BondsGloria Gillian    MR#:  [id number]  DATE OF BIRTH:  1981-06-26  DATE OF ADMISSION:  06/01/2019   ADMITTING PHYSICIAN: Arnaldo NatalMichael S Diamond, MD  DATE OF DISCHARGE: 06/04/19  PRIMARY CARE PHYSICIAN: System, Pcp Not In   ADMISSION DIAGNOSIS:  Epigastric pain [R10.13] Acute pancreatitis, unspecified complication status, unspecified pancreatitis type [K85.90] DISCHARGE DIAGNOSIS:  Active Problems:   Intractable abdominal pain  SECONDARY DIAGNOSIS:  History reviewed. No pertinent past medical history. HOSPITAL COURSE:   Malachi BondsGloria is a 38 year old female who presented to the ED with intractable abdominal pain and diarrhea.  In the ED, lipase was elevated to 1360.  Patient was admitted for further management.  Idiopathic acute pancreatitis- abdominal pain, vomiting, and diarrhea resolved -Right upper quadrant ultrasound with no evidence of cholelithiasis or cholecystitis -Triglyceride level 48 -C. difficile and GI pathogen panel were negative -Able tolerate a full diet on the day of discharge  Bilateral adnexal masses- incidental finding seen on CT abdomen/pelvis, consistent with hydrosalpinx or pyosalpinx. -Pelvic ultrasound with complex adnexal masses, cannot rule out underlying tumor -Pregnancy test was negative -Patient needs to follow-up with her OB/GYN on discharge  DISCHARGE CONDITIONS:  Acute pancreatitis Bilateral adnexal masses CONSULTS OBTAINED:  Gastroenterology DRUG ALLERGIES:  No Known Allergies DISCHARGE MEDICATIONS:   Allergies as of 06/04/2019   No Known Allergies     Medication List    TAKE these medications   HYDROcodone-acetaminophen 5-325 MG tablet Commonly known as: Norco Take 1 tablet by mouth every 8 (eight) hours as needed for moderate pain.   loperamide 2 MG capsule Commonly known as: IMODIUM Take 1 capsule (2 mg total) by mouth 4 (four) times daily as needed for diarrhea or  loose stools.   ondansetron 4 MG tablet Commonly known as: ZOFRAN Take 1 tablet (4 mg total) by mouth every 6 (six) hours.        DISCHARGE INSTRUCTIONS:  1.  Follow-up with PCP in 5 days 2.  Follow-up with OB/GYN in 1 to 2 weeks DIET:  Regular diet DISCHARGE CONDITION:  Good ACTIVITY:  Activity as tolerated OXYGEN:  Home Oxygen: No.  Oxygen Delivery: room air DISCHARGE LOCATION:  home   If you experience worsening of your admission symptoms, develop shortness of breath, life threatening emergency, suicidal or homicidal thoughts you must seek medical attention immediately by calling 911 or calling your MD immediately  if symptoms less severe.  You Must read complete instructions/literature along with all the possible adverse reactions/side effects for all the Medicines you take and that have been prescribed to you. Take any new Medicines after you have completely understood and accpet all the possible adverse reactions/side effects.   Please note  You were cared for by a hospitalist during your hospital stay. If you have any questions about your discharge medications or the care you received while you were in the hospital after you are discharged, you can call the unit and asked to speak with the hospitalist on call if the hospitalist that took care of you is not available. Once you are discharged, your primary care physician will handle any further medical issues. Please note that NO REFILLS for any discharge medications will be authorized once you are discharged, as it is imperative that you return to your primary care physician (or establish a relationship with a primary care physician if you do not have one) for your aftercare needs so that they can reassess your  need for medications and monitor your lab values.    On the day of Discharge:  VITAL SIGNS:  Blood pressure 96/65, pulse 66, temperature (!) 97.4 F (36.3 C), temperature source Oral, resp. rate 17, height 4\' 11"   (1.499 m), weight 50.4 kg, last menstrual period 05/24/2019, SpO2 99 %. PHYSICAL EXAMINATION:  GENERAL:  38 y.o.-year-old patient lying in the bed with no acute distress.  EYES: Pupils equal, round, reactive to light and accommodation. No scleral icterus. Extraocular muscles intact.  HEENT: Head atraumatic, normocephalic. Oropharynx and nasopharynx clear.  NECK:  Supple, no jugular venous distention. No thyroid enlargement, no tenderness.  LUNGS: Normal breath sounds bilaterally, no wheezing, rales,rhonchi or crepitation. No use of accessory muscles of respiration.  CARDIOVASCULAR: S1, S2 normal. No murmurs, rubs, or gallops.  ABDOMEN: Soft, non-distended. Bowel sounds present. No organomegaly or mass. + Mild diffuse tenderness to palpation, no rebound or guarding. EXTREMITIES: No pedal edema, cyanosis, or clubbing.  NEUROLOGIC: Cranial nerves II through XII are intact. Muscle strength 5/5 in all extremities. Sensation intact. Gait not checked.  PSYCHIATRIC: The patient is alert and oriented x 3.  SKIN: No obvious rash, lesion, or ulcer.  DATA REVIEW:   CBC Recent Labs  Lab 06/04/19 0733  WBC 7.1  HGB 14.0  HCT 40.8  PLT 207    Chemistries  Recent Labs  Lab 06/02/19 0352  06/04/19 0733  NA 140   < > 139  K 3.5   < > 3.5  CL 109   < > 110  CO2 25   < > 23  GLUCOSE 93   < > 90  BUN 6   < > 13  CREATININE 0.65   < > 0.50  CALCIUM 8.4*   < > 8.4*  MG 1.8   < > 2.1  AST 14*  --   --   ALT 12  --   --   ALKPHOS 65  --   --   BILITOT 0.6  --   --    < > = values in this interval not displayed.     Microbiology Results  Results for orders placed or performed during the hospital encounter of 06/01/19  SARS Coronavirus 2 (CEPHEID - Performed in [facility] lab), Hosp Order     Status: None   Collection Time: 06/01/19  1:17 AM   Specimen: Nasopharyngeal Swab  Result Value Ref Range Status   SARS Coronavirus 2 NEGATIVE NEGATIVE Final    Comment: (NOTE) If result is  NEGATIVE SARS-CoV-2 target nucleic acids are NOT DETECTED. The SARS-CoV-2 RNA is generally detectable in upper and lower  respiratory specimens during the acute phase of infection. The lowest  concentration of SARS-CoV-2 viral copies this assay can detect is 250  copies / mL. A negative result does not preclude SARS-CoV-2 infection  and should not be used as the sole basis for treatment or other  patient management decisions.  A negative result may occur with  improper specimen collection / handling, submission of specimen other  than nasopharyngeal swab, presence of viral mutation(s) within the  areas targeted by this assay, and inadequate number of viral copies  (<250 copies / mL). A negative result must be combined with clinical  observations, patient history, and epidemiological information. If result is POSITIVE SARS-CoV-2 target nucleic acids are DETECTED. The SARS-CoV-2 RNA is generally detectable in upper and lower  respiratory specimens dur ing the acute phase of infection.  Positive  results are indicative of  active infection with SARS-CoV-2.  Clinical  correlation with patient history and other diagnostic information is  necessary to determine patient infection status.  Positive results do  not rule out bacterial infection or co-infection with other viruses. If result is PRESUMPTIVE POSTIVE SARS-CoV-2 nucleic acids MAY BE PRESENT.   A presumptive positive result was obtained on the submitted specimen  and confirmed on repeat testing.  While 2019 novel coronavirus  (SARS-CoV-2) nucleic acids may be present in the submitted sample  additional confirmatory testing may be necessary for epidemiological  and / or clinical management purposes  to differentiate between  SARS-CoV-2 and other Sarbecovirus currently known to infect humans.  If clinically indicated additional testing with an alternate test  methodology [id number]) is advised. The SARS-CoV-2 RNA is generally  detectable  in upper and lower respiratory sp ecimens during the acute  phase of infection. The expected result is Negative. Fact Sheet for Patients:  StrictlyIdeas.no Fact Sheet for Healthcare Providers: BankingDealers.co.za This test is not yet approved or cleared by the [place] FDA and has been authorized for detection and/or diagnosis of SARS-CoV-2 by FDA under an Emergency Use Authorization (EUA).  This EUA will remain in effect (meaning this test can be used) for the duration of the COVID-19 declaration under Section 564(b)(1) of the Act, 21 U.S.C. section 360bbb-3(b)(1), unless the authorization is terminated or revoked sooner. Performed at [facility], [address] [place], [place] [address]   Gastrointestinal Panel by PCR , Stool     Status: None   Collection Time: 06/02/19 12:00 PM   Specimen: Stool  Result Value Ref Range Status   Campylobacter species NOT DETECTED NOT DETECTED Final   Plesimonas shigelloides NOT DETECTED NOT DETECTED Final   Salmonella species NOT DETECTED NOT DETECTED Final   Yersinia enterocolitica NOT DETECTED NOT DETECTED Final   Vibrio species NOT DETECTED NOT DETECTED Final   Vibrio cholerae NOT DETECTED NOT DETECTED Final   Enteroaggregative E coli (EAEC) NOT DETECTED NOT DETECTED Final   Enteropathogenic E coli (EPEC) NOT DETECTED NOT DETECTED Final   Enterotoxigenic E coli (ETEC) NOT DETECTED NOT DETECTED Final   Shiga like toxin producing E coli (STEC) NOT DETECTED NOT DETECTED Final   Shigella/Enteroinvasive E coli (EIEC) NOT DETECTED NOT DETECTED Final   Cryptosporidium NOT DETECTED NOT DETECTED Final   Cyclospora cayetanensis NOT DETECTED NOT DETECTED Final   Entamoeba histolytica NOT DETECTED NOT DETECTED Final   Giardia lamblia NOT DETECTED NOT DETECTED Final   Adenovirus F40/41 NOT DETECTED NOT DETECTED Final   Astrovirus NOT DETECTED NOT DETECTED Final   Norovirus GI/GII NOT  DETECTED NOT DETECTED Final   Rotavirus A NOT DETECTED NOT DETECTED Final   Sapovirus (I, II, IV, and V) NOT DETECTED NOT DETECTED Final    Comment: Performed at [facility], [address] [place], [place] [address]  C difficile quick scan w PCR reflex     Status: None   Collection Time: 06/02/19 12:15 PM   Specimen: STOOL  Result Value Ref Range Status   C Diff antigen NEGATIVE NEGATIVE Final   C Diff toxin NEGATIVE NEGATIVE Final   C Diff interpretation No C. difficile detected.  Final    Comment: Performed at [facility], [address] [place], [place] [address]    RADIOLOGY:  No results found.   Management plans discussed with the patient, family and they are in agreement.  CODE STATUS: Full Code   TOTAL TIME TAKING CARE OF THIS  PATIENT: 45 minutes.    Jinny BlossomKaty D  M.D on 06/04/2019 at 11:35 AM  Between 7am to 6pm - Pager - [phone]  After 6pm go to [email or web address] - Social research officer, governmentpassword [email or web address]  [facility] [place] [facility]  Office  [phone]  CC: Primary care physician; System, Pcp Not In   Note: This dictation was prepared with Dragon dictation along with smaller phrase technology. Any transcriptional errors that result from this process are unintentional.

## 2019-06-15 ENCOUNTER — Encounter: Payer: Self-pay | Admitting: Obstetrics and Gynecology

## 2019-06-15 ENCOUNTER — Other Ambulatory Visit: Payer: Self-pay

## 2019-06-15 ENCOUNTER — Ambulatory Visit (INDEPENDENT_AMBULATORY_CARE_PROVIDER_SITE_OTHER): Payer: BC Managed Care – PPO | Admitting: Obstetrics and Gynecology

## 2019-06-15 VITALS — BP 100/50 | HR 81 | Ht 59.0 in | Wt 109.0 lb

## 2019-06-15 DIAGNOSIS — N9489 Other specified conditions associated with female genital organs and menstrual cycle: Secondary | ICD-10-CM

## 2019-06-15 NOTE — Progress Notes (Signed)
Patient ID: Stephanie Heath, female   DOB: 07/31/1981, 37 y.o.   MRN: [id number]  Reason for Consult: Follow-up (ER follow up, pain is better)   Referred by No ref. provider found  Subjective:     HPI:  Stephanie Heath is a 38 y.o. female she presents today for referral because of adnexal masses which were discovered during a recent hospitalization.   History reviewed. No pertinent past medical history. Family History  Problem Relation Age of Onset  . Diabetes Mother   . Cancer Cousin 32       thyroid   Past Surgical History:  Procedure Laterality Date  . NO PAST SURGERIES      Short Social History:  Social History   Tobacco Use  . Smoking status: Never Smoker  . Smokeless tobacco: Never Used  Substance Use Topics  . Alcohol use: Never    Frequency: Never    No Known Allergies  Current Outpatient Medications  Medication Sig Dispense Refill  . HYDROcodone-acetaminophen (NORCO) 5-325 MG tablet Take 1 tablet by mouth every 8 (eight) hours as needed for moderate pain. (Patient not taking: Reported on 06/15/2019) 12 tablet 0  . loperamide (IMODIUM) 2 MG capsule Take 1 capsule (2 mg total) by mouth 4 (four) times daily as needed for diarrhea or loose stools. (Patient not taking: Reported on 06/15/2019) 12 capsule 0  . ondansetron (ZOFRAN) 4 MG tablet Take 1 tablet (4 mg total) by mouth every 6 (six) hours. (Patient not taking: Reported on 06/15/2019) 12 tablet 0   No current facility-administered medications for this visit.     Review of Systems  Constitutional: Negative for chills, fatigue, fever and unexpected weight change.  HENT: Negative for trouble swallowing.  Eyes: Negative for loss of vision.  Respiratory: Negative for cough, shortness of breath and wheezing.  Cardiovascular: Negative for chest pain, leg swelling, palpitations and syncope.  GI: Negative for abdominal pain, blood in stool, diarrhea, nausea and vomiting.  GU: Negative for difficulty urinating, dysuria,  frequency and hematuria.  Musculoskeletal: Negative for back pain, leg pain and joint pain.  Skin: Negative for rash.  Neurological: Negative for dizziness, headaches, light-headedness, numbness and seizures.  Psychiatric: Negative for behavioral problem, confusion, depressed mood and sleep disturbance.        Objective:  Objective   Vitals:   06/15/19 1010  BP: (!) 100/50  Pulse: 81  Weight: 109 lb (49.4 kg)  Height: 4\' 11"  (1.499 m)   Body mass index is 22.02 kg/m.  Physical Exam Vitals signs and nursing note reviewed.  Constitutional:      Appearance: She is well-developed.  HENT:     Head: Normocephalic and atraumatic.  Eyes:     Pupils: Pupils are equal, round, and reactive to light.  Cardiovascular:     Rate and Rhythm: Normal rate and regular rhythm.  Pulmonary:     Effort: Pulmonary effort is normal. No respiratory distress.  Skin:    General: Skin is warm and dry.  Neurological:     Mental Status: She is alert and oriented to person, place, and time.  Psychiatric:        Behavior: Behavior normal.        Thought Content: Thought content normal.        Judgment: Judgment normal.   No cmt,  Fixed pelvis, bilateral adnexal fullness No uterine or adnexal tenderness       Assessment/Plan:     38 yo with an adnexal mass Pregnancy test  GC/CT testing [place] today Likely hydrosalpinx   Adelene Idlerhristanna Cydne Grahn MD [facility], [facility] 06/15/2019 11:05 AM

## 2019-06-16 LAB — OVARIAN MALIGNANCY RISK-ROMA
Cancer Antigen (CA) 125: 17.3 U/mL (ref 0.0–38.1)
HE4: 44.4 pmol/L (ref 0.0–61.2)
Postmenopausal ROMA: 1.13
Premenopausal ROMA: 0.58

## 2019-06-16 LAB — PREMENOPAUSAL INTERP: LOW

## 2019-06-16 LAB — POSTMENOPAUSAL INTERP: LOW

## 2019-06-17 NOTE — Progress Notes (Signed)
WNL, called and discussed with patient.

## 2019-07-04 ENCOUNTER — Encounter: Payer: Self-pay | Admitting: Obstetrics and Gynecology

## 2019-07-04 ENCOUNTER — Ambulatory Visit (INDEPENDENT_AMBULATORY_CARE_PROVIDER_SITE_OTHER): Payer: BC Managed Care – PPO | Admitting: Obstetrics and Gynecology

## 2019-07-04 ENCOUNTER — Other Ambulatory Visit: Payer: Self-pay

## 2019-07-04 VITALS — BP 100/60 | HR 65 | Ht 59.0 in | Wt 111.0 lb

## 2019-07-04 DIAGNOSIS — N7011 Chronic salpingitis: Secondary | ICD-10-CM | POA: Diagnosis not present

## 2019-07-04 DIAGNOSIS — N9489 Other specified conditions associated with female genital organs and menstrual cycle: Secondary | ICD-10-CM | POA: Diagnosis not present

## 2019-07-04 NOTE — Progress Notes (Signed)
Patient ID: Stephanie Heath, female   DOB: 03/10/1981, 38 y.o.   MRN: [id number]  Reason for Consult: Follow-up   Referred by No ref. provider found  Subjective:     HPI:  Stephanie Heath is a 38 y.o. female. She is following up today for the adnexal masses/ hydrosalpinx which were discovered incidentally on recent hospitalization. She had tumor marker labs which were normal as well as negative gonorrhea and chlamydia testing at her last visit.   History reviewed. No pertinent past medical history. Family History  Problem Relation Age of Onset  . Diabetes Mother   . Cancer Cousin 32       thyroid   Past Surgical History:  Procedure Laterality Date  . NO PAST SURGERIES      Short Social History:  Social History   Tobacco Use  . Smoking status: Never Smoker  . Smokeless tobacco: Never Used  Substance Use Topics  . Alcohol use: Never    Frequency: Never    No Known Allergies  Current Outpatient Medications  Medication Sig Dispense Refill  . HYDROcodone-acetaminophen (NORCO) 5-325 MG tablet Take 1 tablet by mouth every 8 (eight) hours as needed for moderate pain. (Patient not taking: Reported on 06/15/2019) 12 tablet 0  . loperamide (IMODIUM) 2 MG capsule Take 1 capsule (2 mg total) by mouth 4 (four) times daily as needed for diarrhea or loose stools. (Patient not taking: Reported on 06/15/2019) 12 capsule 0  . ondansetron (ZOFRAN) 4 MG tablet Take 1 tablet (4 mg total) by mouth every 6 (six) hours. (Patient not taking: Reported on 06/15/2019) 12 tablet 0   No current facility-administered medications for this visit.     Review of Systems  Constitutional: Negative for chills, fatigue, fever and unexpected weight change.  HENT: Negative for trouble swallowing.  Eyes: Negative for loss of vision.  Respiratory: Negative for cough, shortness of breath and wheezing.  Cardiovascular: Negative for chest pain, leg swelling, palpitations and syncope.  GI: Negative for abdominal pain,  blood in stool, diarrhea, nausea and vomiting.  GU: Negative for difficulty urinating, dysuria, frequency and hematuria.  Musculoskeletal: Negative for back pain, leg pain and joint pain.  Skin: Negative for rash.  Neurological: Negative for dizziness, headaches, light-headedness, numbness and seizures.  Psychiatric: Negative for behavioral problem, confusion, depressed mood and sleep disturbance.        Objective:  Objective   Vitals:   07/04/19 0814  BP: 100/60  Pulse: 65  Weight: 111 lb (50.3 kg)  Height: 4\' 11"  (1.499 m)   Body mass index is 22.42 kg/m.  Physical Exam Vitals signs and nursing note reviewed.  Constitutional:      Appearance: She is well-developed.  HENT:     Head: Normocephalic and atraumatic.  Eyes:     Pupils: Pupils are equal, round, and reactive to light.  Cardiovascular:     Rate and Rhythm: Normal rate and regular rhythm.  Pulmonary:     Effort: Pulmonary effort is normal. No respiratory distress.  Abdominal:     General: Abdomen is flat. There is no distension.     Palpations: Abdomen is soft. There is no mass.     Tenderness: There is no abdominal tenderness. There is no guarding or rebound.  Skin:    General: Skin is warm and dry.  Neurological:     Mental Status: She is alert and oriented to person, place, and time.  Psychiatric:        Behavior: Behavior normal.  Thought Content: Thought content normal.        Judgment: Judgment normal.        Assessment/Plan:     38 yo with pelvic mass, likely hydrosalpinx. Likely related to he history of untreated chlamydia.  Patient not interested in conceiving. She is not having pain from the masses. She is not interested in surgical intervention at this time. She will return if she has pain or discomfort.  She will follow up for a pelvic US in 1 year to monitor.   More than 20 minutes were spent face to face with the patient in the room with more than 50% of the time spent providing  counseling and discussing the plan of management.   Adelene Idlerhristanna Eldwin Volkov MD [facility], [facility] 07/04/2019 8:57 AM

## 2019-07-05 ENCOUNTER — Encounter: Payer: Self-pay | Admitting: Obstetrics and Gynecology

## 2019-10-11 LAB — POCT LIPID PANEL
HDL: 52
LDL: 84
Non-HDL: 105
POC Glucose: 105 mg/dl — AB (ref 70–99)
TC/HDL: 3
TC: 157
TRG: 107

## 2019-10-24 ENCOUNTER — Other Ambulatory Visit: Payer: Self-pay

## 2019-10-24 DIAGNOSIS — Z008 Encounter for other general examination: Secondary | ICD-10-CM

## 2019-10-24 NOTE — Progress Notes (Signed)
     Patient ID: Stephanie Heath, female    DOB: 21-Oct-1981, 38 y.o.   MRN: [id number]    Thank you!!  Apolonio Schneiders RN  [place] Nurse Specialist [place]: [phone]  Cell:  [phone] Website: [email or web address]

## 2019-12-07 ENCOUNTER — Ambulatory Visit: Payer: BC Managed Care – PPO | Attending: Internal Medicine

## 2019-12-07 DIAGNOSIS — Z20822 Contact with and (suspected) exposure to covid-19: Secondary | ICD-10-CM

## 2019-12-08 LAB — NOVEL CORONAVIRUS, NAA: SARS-CoV-2, NAA: NOT DETECTED

## 2020-08-06 ENCOUNTER — Telehealth: Payer: Self-pay

## 2020-09-11 IMAGING — US ULTRASOUND ABDOMEN LIMITED
1 series · 14 of 25 positions shown · non-contrast
Comparison: None.

CLINICAL DATA: pancreatitis, evaluate for cholecystitis

EXAM:
ULTRASOUND ABDOMEN LIMITED RIGHT UPPER QUADRANT

[Series 1: ultrasound abdomen limited · 0.20mm/px · 14 of 37 slices shown]
[im 1/37]
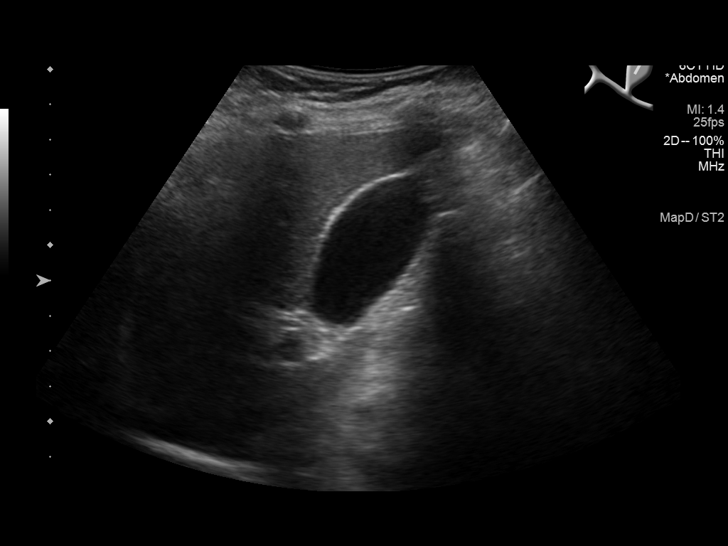
[im 4/37]
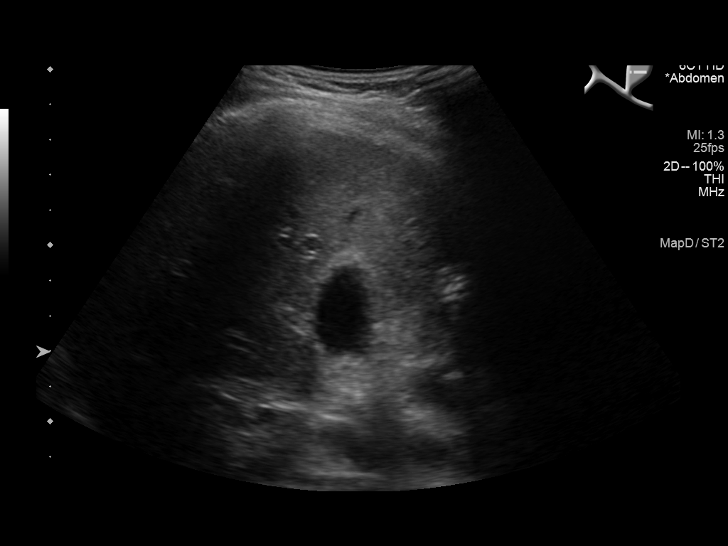
[im 7/37]
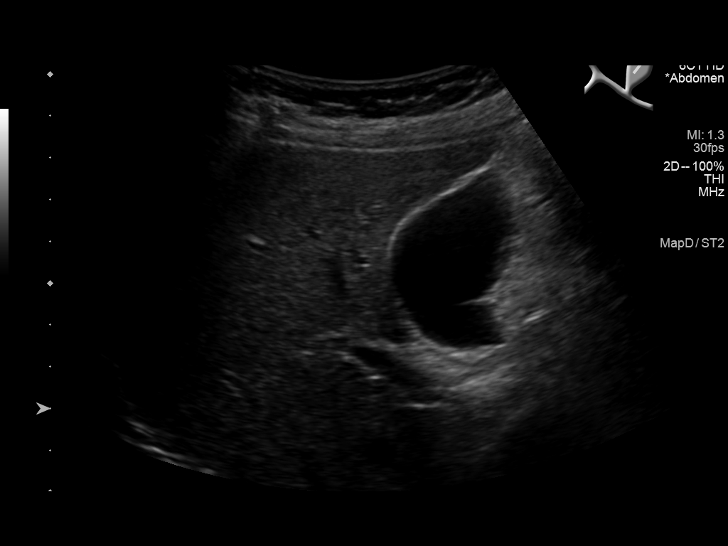
[im 10/37]
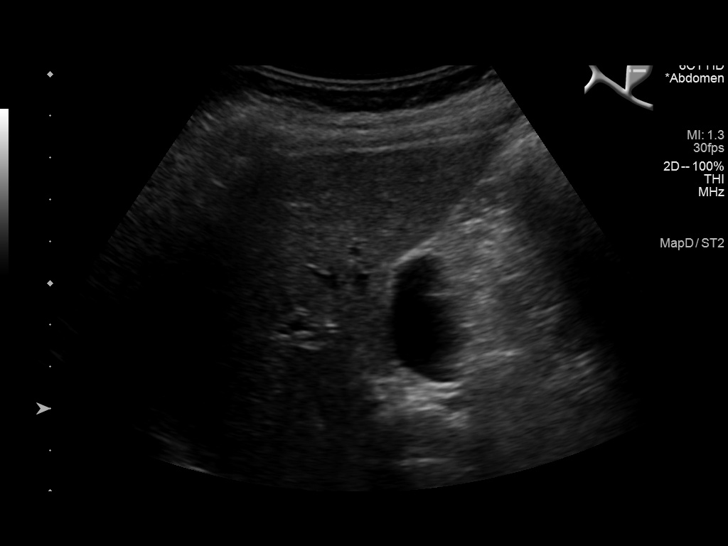
[im 13/37]
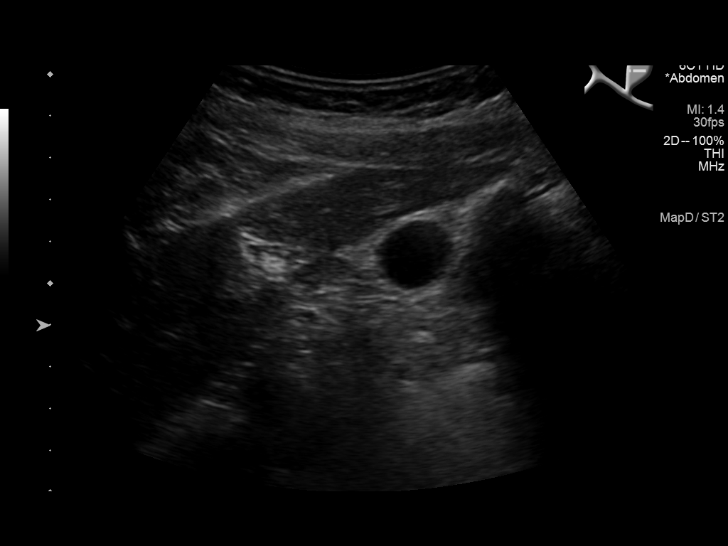
[im 14/37]
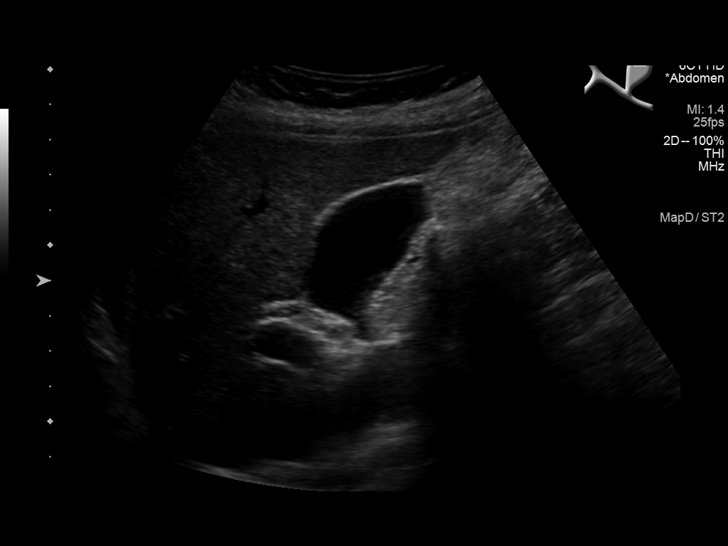
[im 17/37]
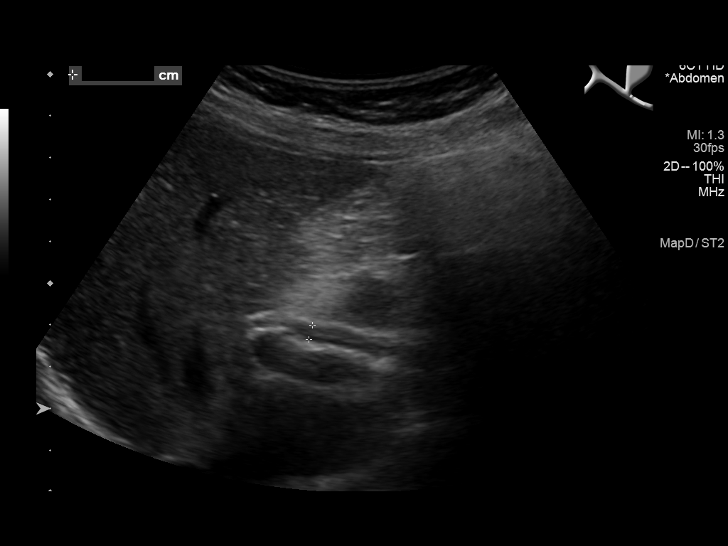
[im 20/37]
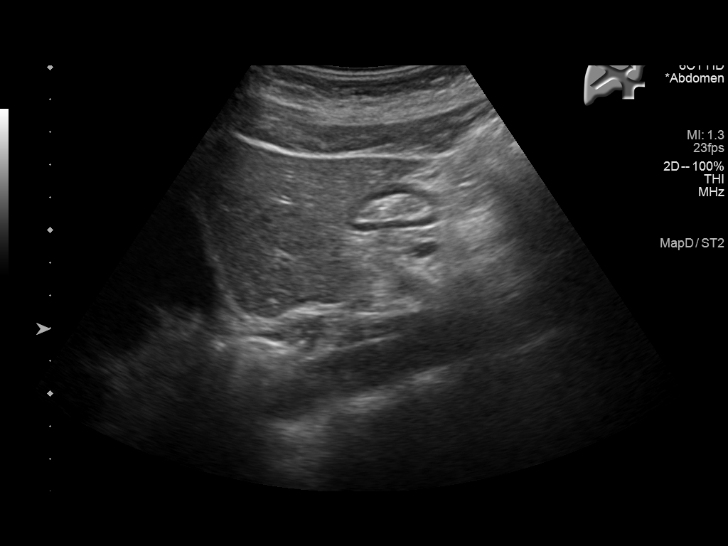
[im 23/37]
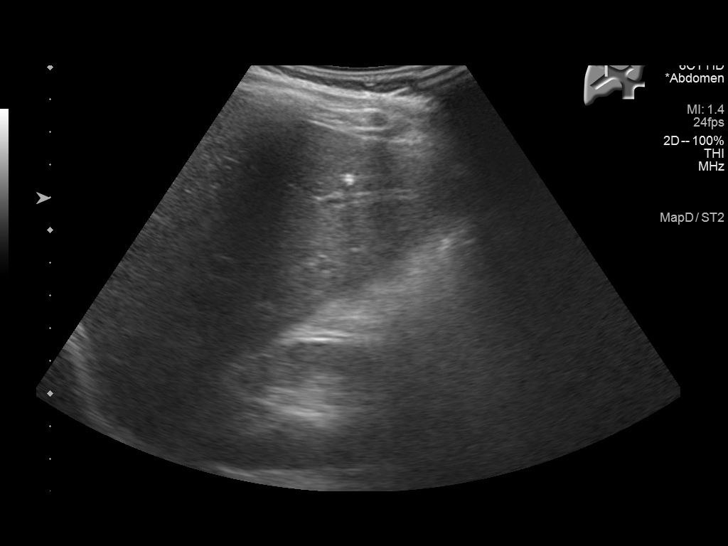
[im 25/37]
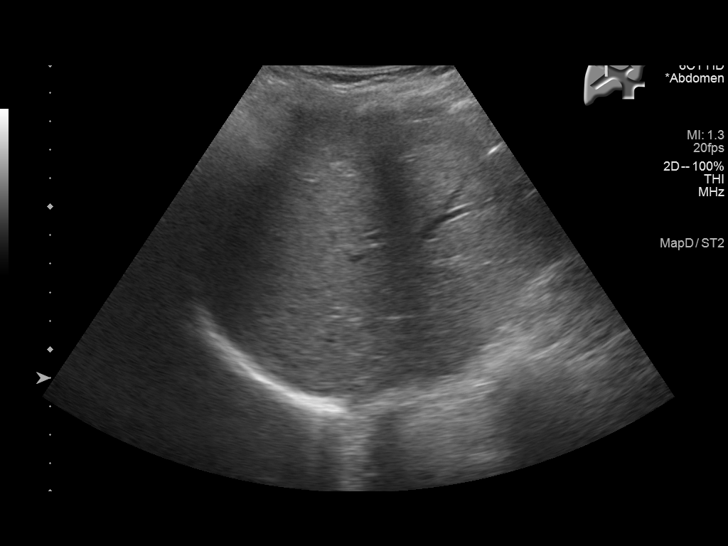
[im 28/37]
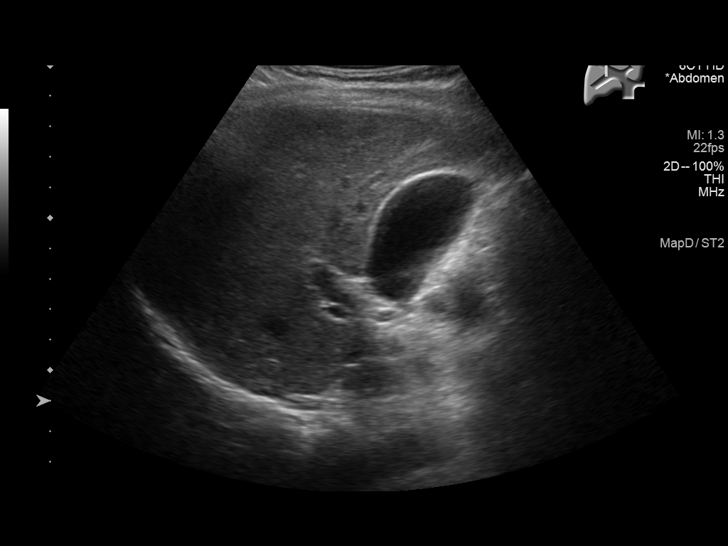
[im 31/37]
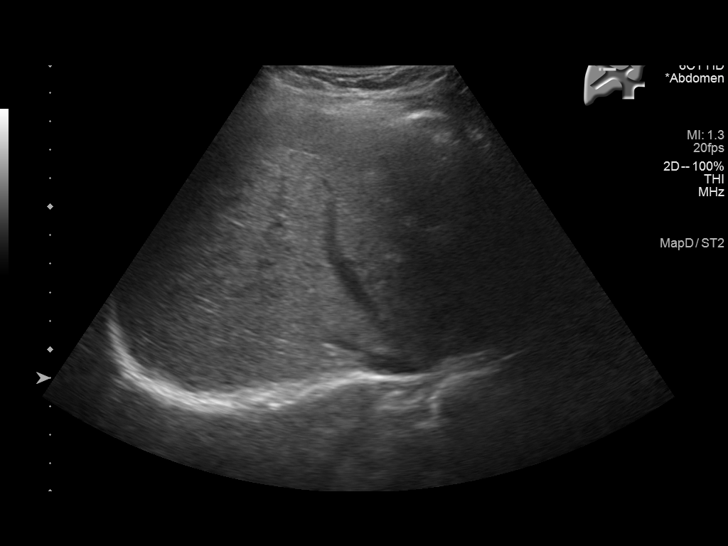
[im 34/37]
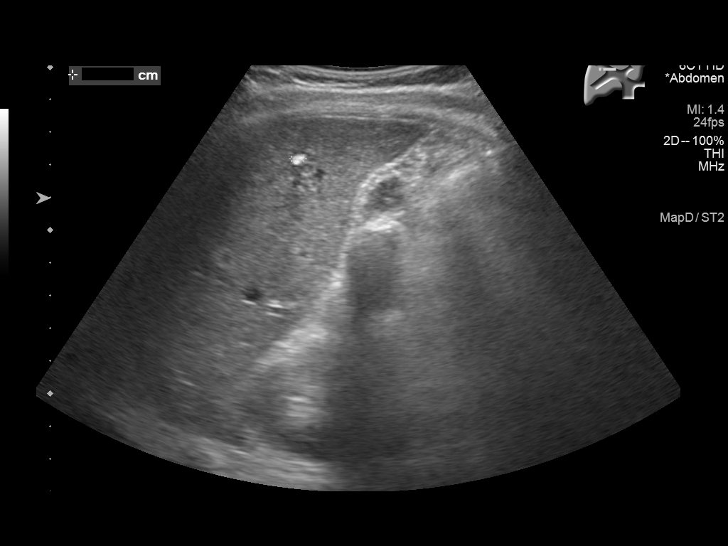
[im 37/37]
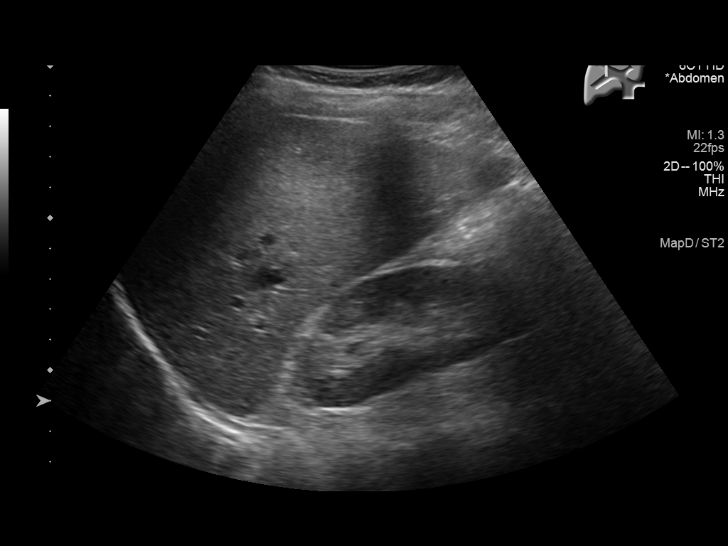

[14 of 25 positions shown; findings below may reference images not displayed]

FINDINGS: Gallbladder:

No gallstones or wall thickening visualized. No sonographic Murphy
sign noted by sonographer.

Common bile duct:

Diameter: Normal caliber, 4 mm

Liver:

Small benign calcification. Normal echotexture. No suspicious mass
or biliary duct dilatation. Portal vein is patent on color Doppler
imaging with normal direction of blood flow towards the liver.
IMPRESSION: No acute findings.  No evidence of cholelithiasis or cholecystitis.

## 2020-09-12 IMAGING — CT CT ABDOMEN AND PELVIS WITHOUT CONTRAST
2 of 4 series · 15 of 46 positions shown, 17 images · non-contrast
Comparison: None.

CLINICAL DATA: Back pain, cancer or infection suspected. Patient
reports upper abdominal pain, nausea and vomiting for 3 days.
Diarrhea.

EXAM:
CT ABDOMEN AND PELVIS WITHOUT CONTRAST
TECHNIQUE: Multidetector CT imaging of the abdomen and pelvis was performed
following the standard protocol without IV contrast.

[Series 2: routine abd/pel wo · axial · 0.73mm/px · z∈[-140,+260]mm · 12 of 88 slices shown, 14 images]
[im 4/88  soft-tissue]
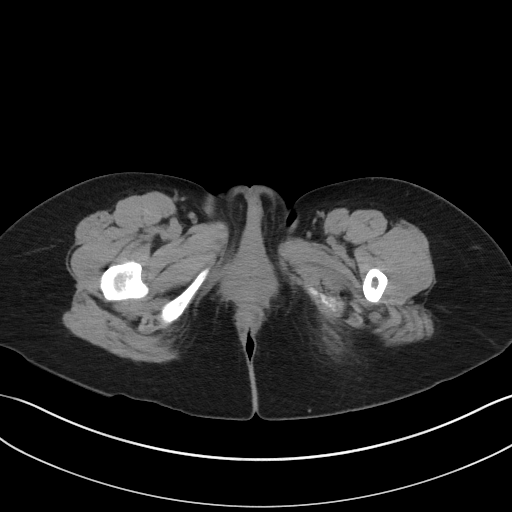
[im 4/88  bone]
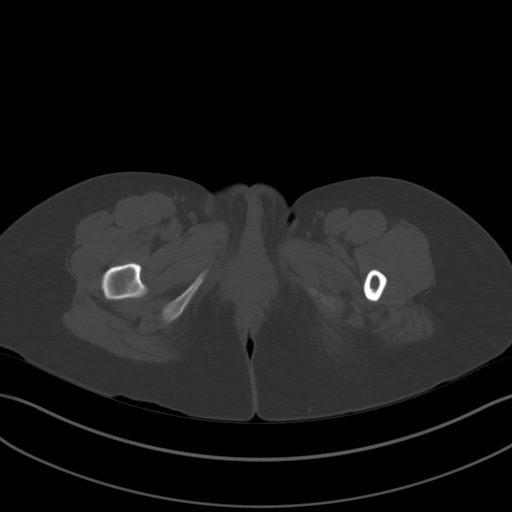
[im 11/88  soft-tissue]
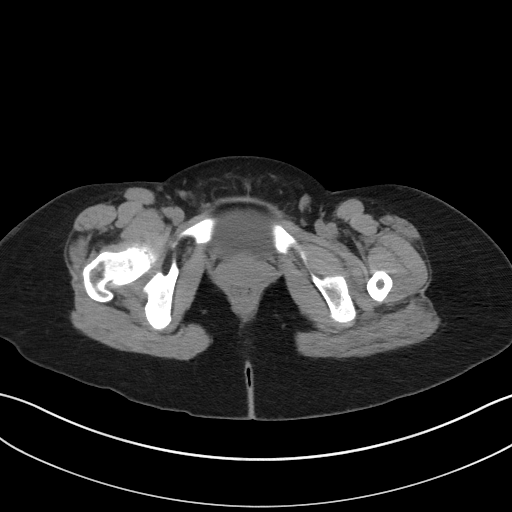
[im 19/88  soft-tissue]
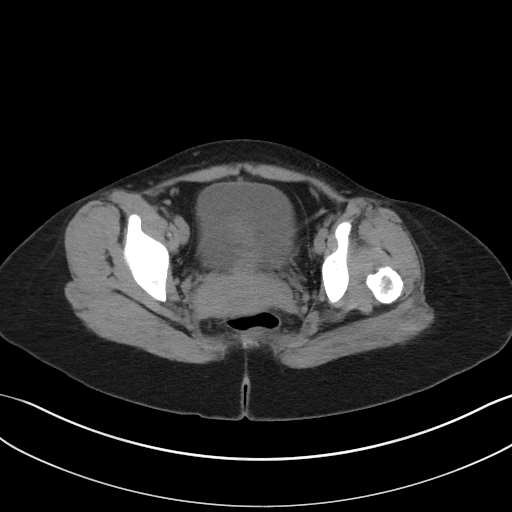
[im 26/88  soft-tissue]
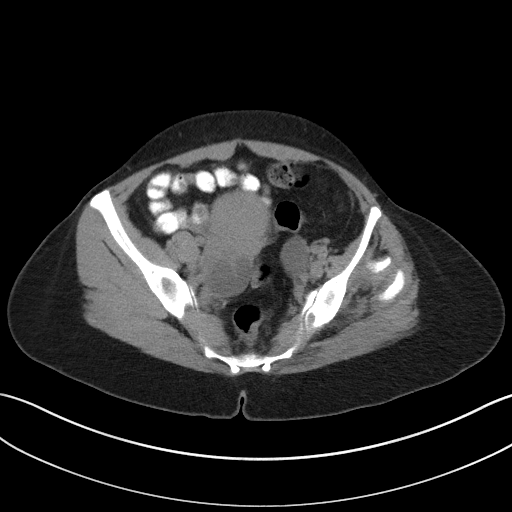
[im 33/88  soft-tissue]
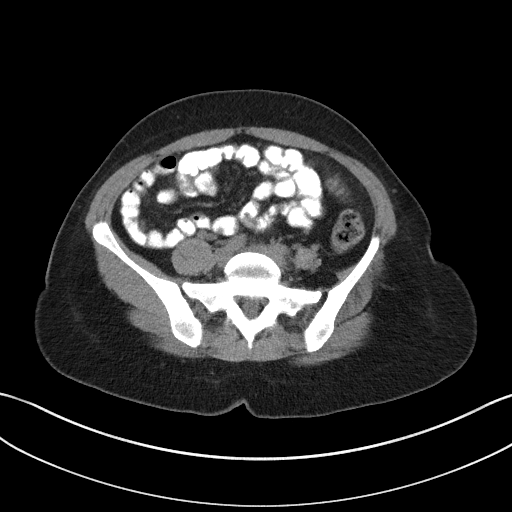
[im 40/88  soft-tissue]
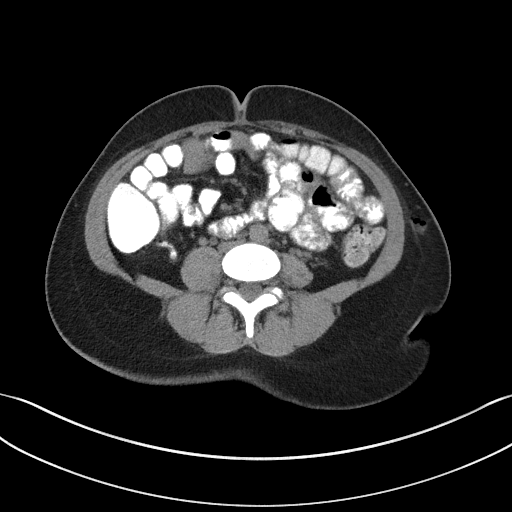
[im 48/88  soft-tissue]
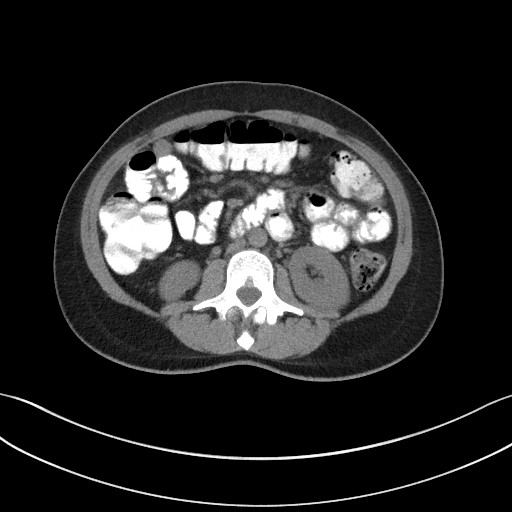
[im 55/88  soft-tissue]
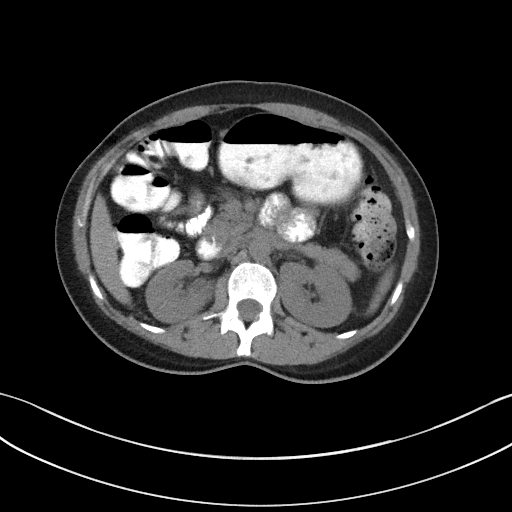
[im 62/88  soft-tissue]
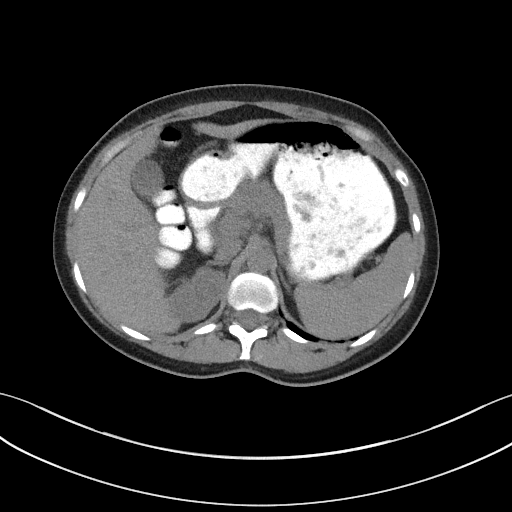
[im 62/88  bone]
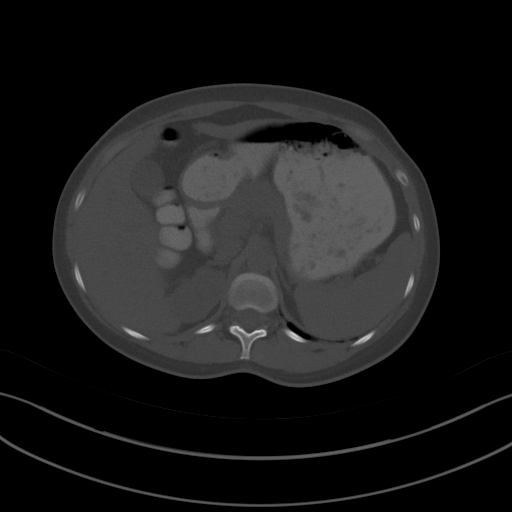
[im 69/88  soft-tissue]
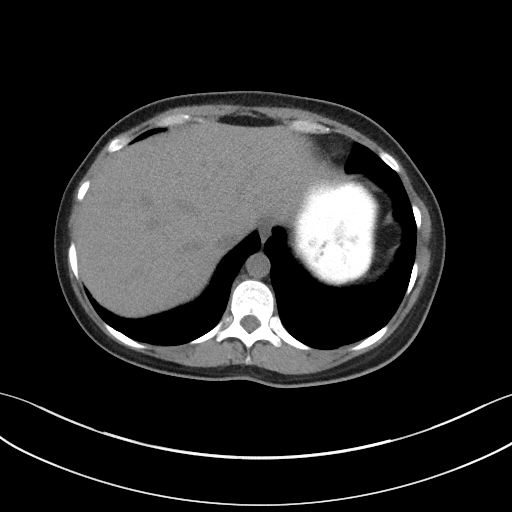
[im 77/88  soft-tissue]
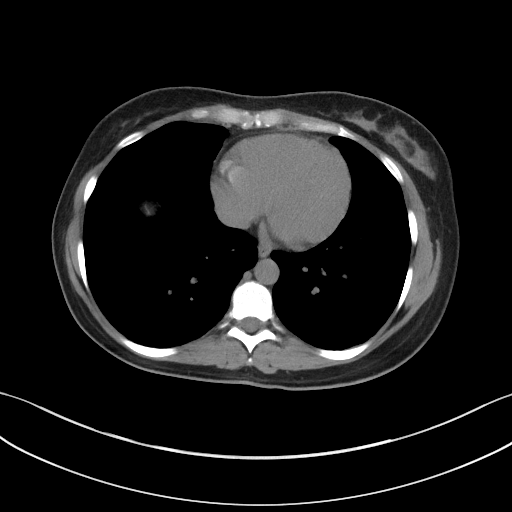
[im 84/88  soft-tissue]
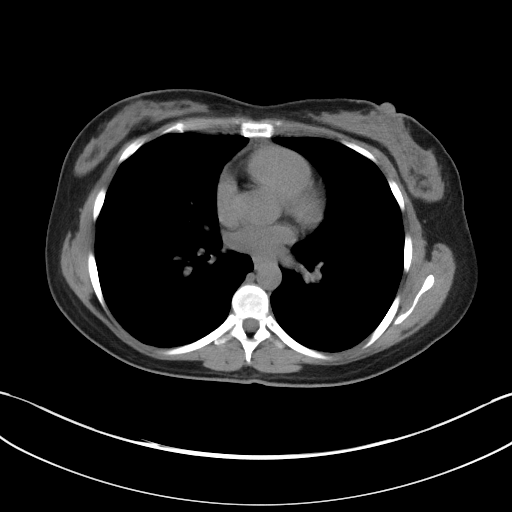

[Series 5: coronal st · coronal · 0.67mm/px · 3 of 76 slices shown]
[im 26/76  soft-tissue]
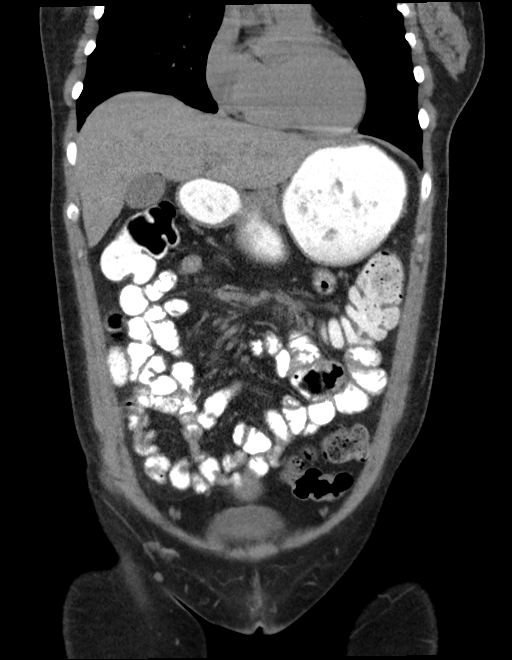
[im 34/76  soft-tissue]
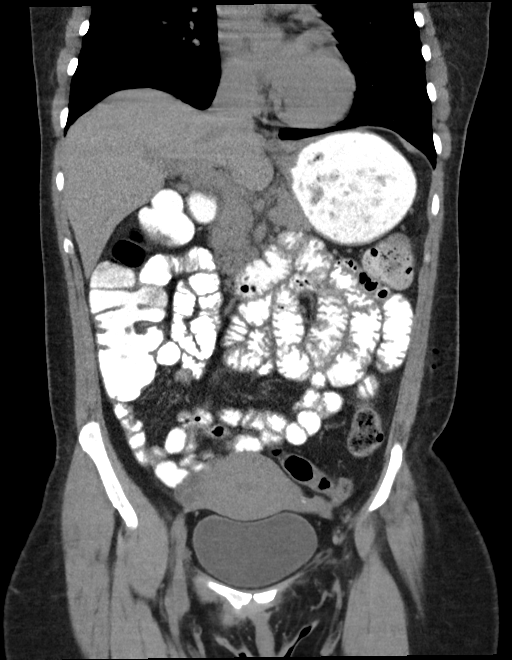
[im 42/76  soft-tissue]
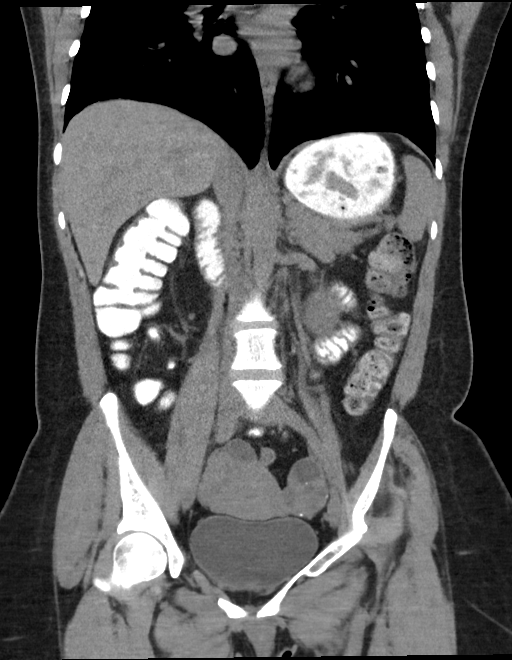

[15 of 46 positions shown; findings below may reference images not displayed]

FINDINGS: Lower chest: Lung bases are clear.

Hepatobiliary: No focal abnormality allowing for lack of contrast.
Gallbladder physiologically distended, no calcified stone. No
biliary dilatation.

Pancreas: No ductal dilatation or inflammation. No peripancreatic
fluid collection.

Spleen: Normal in size without focal abnormality.

Adrenals/Urinary Tract: Normal adrenal glands. 5 mm calcification in
the upper right kidney may be parenchymal or nonobstructing stone,
there is adjacent cortical scarring. Slight contour irregularity of
the uppermost pole of the right kidney which may be due to
underlying cysts or scarring, not well-defined in the absence of IV
contrast. No hydronephrosis. No perinephric edema. Tiny cortical
cyst in the mid left kidney. Urinary bladder is physiologically
distended. No bladder wall thickening.

Stomach/Bowel: Stomach is within normal limits. Appendix appears
normal. No evidence of bowel wall thickening, distention, or
inflammatory changes. Administered enteric contrast reaches the
colon.

Vascular/Lymphatic: Noncontrast vascular structures are
unremarkable. No bulky abdominopelvic adenopathy.

Reproductive: Tubular cystic structure in both adnexa suspicious for
hydro or pyosalpinx, better defined on the right. No significant
pelvic fat stranding or inflammation. The unenhanced uterus is
unremarkable.

Other: No free fluid or free air. Tiny fat containing umbilical
hernia.

Musculoskeletal: Dysplastic left acetabulum and femoral head.
Proximal femur is dislocated superiorly with aplastic appearance to
the femoral head and questionable pseudoarticulation of the lateral
aspect is the iliac crest. Left ileus psoas muscle atrophy compared
to right. No acute osseous abnormality.
IMPRESSION: 1. Tubular cystic structures in both adnexa suspicious for hydro or
pyosalpinx. No associated pelvic inflammation or fat stranding to
suggest acute inflammation, favoring chronicity. This could be
further characterized with pelvic ultrasound.
2. Nonobstructing stone versus cortical calcification in the right
upper kidney with adjacent cortical scarring.
3. Chronic superior subluxation of the left proximal femur with
dysplastic appearance of the acetabulum and femoral head.

## 2020-09-13 IMAGING — US US PELVIS COMPLETE WITH TRANSVAGINAL
1 series · 13 of 25 positions shown · non-contrast
Comparison: 06/02/2019.

CLINICAL DATA: Possible hydrosalpinx/pyosalpinx on CT.

EXAM:
TRANSABDOMINAL AND TRANSVAGINAL ULTRASOUND OF PELVIS
TECHNIQUE: Both transabdominal and transvaginal ultrasound examinations of the
pelvis were performed. Transabdominal technique was performed for
global imaging of the pelvis including uterus, ovaries, adnexal
regions, and pelvic cul-de-sac. It was necessary to proceed with
endovaginal exam following the transabdominal exam to visualize the
uterus and ovaries.

[Series 1: us pelvis complete with transvaginal · 0.18mm/px · 13 of 126 slices shown]
[im 1/126]
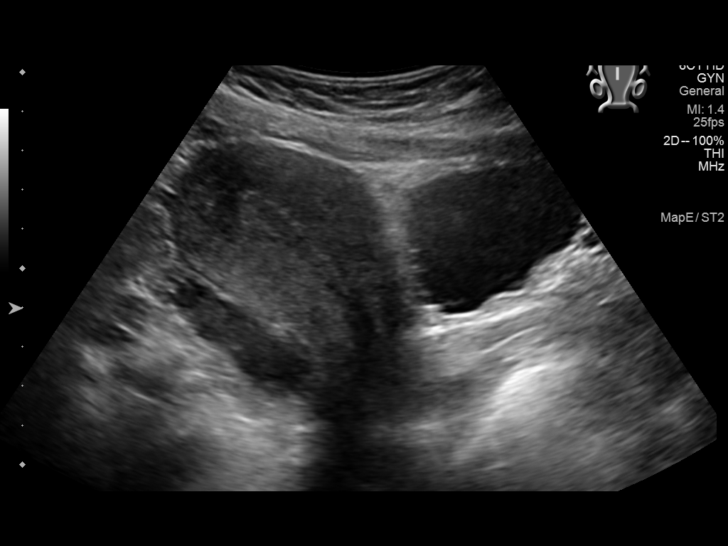
[im 11/126]
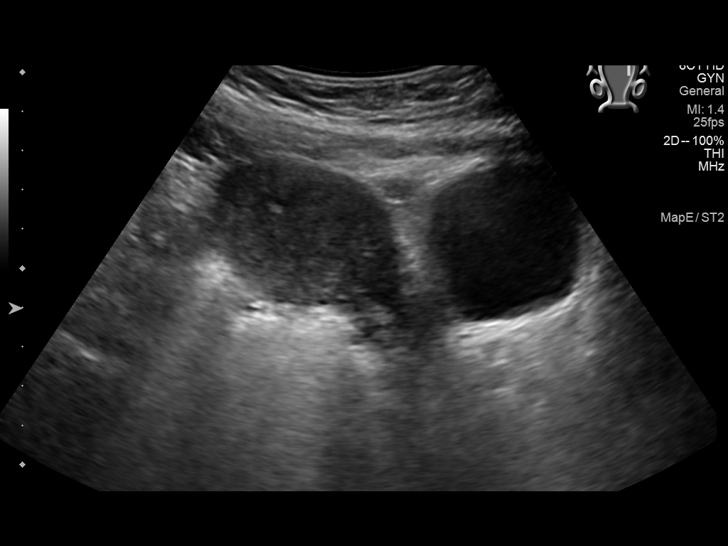
[im 21/126]
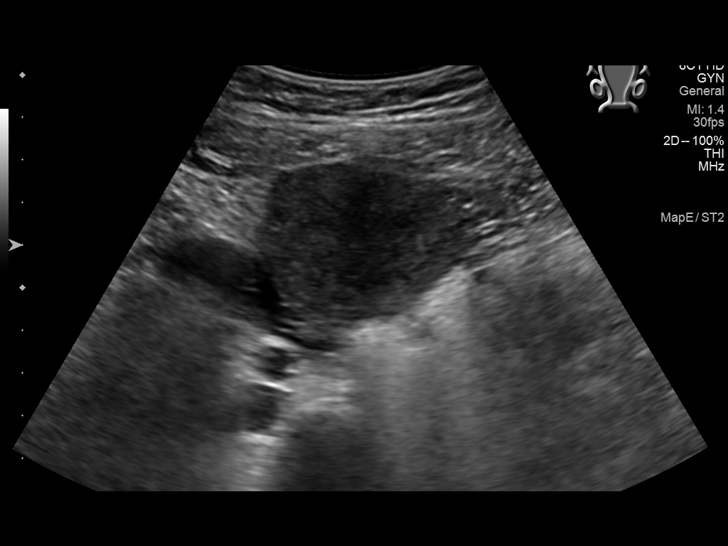
[im 32/126]
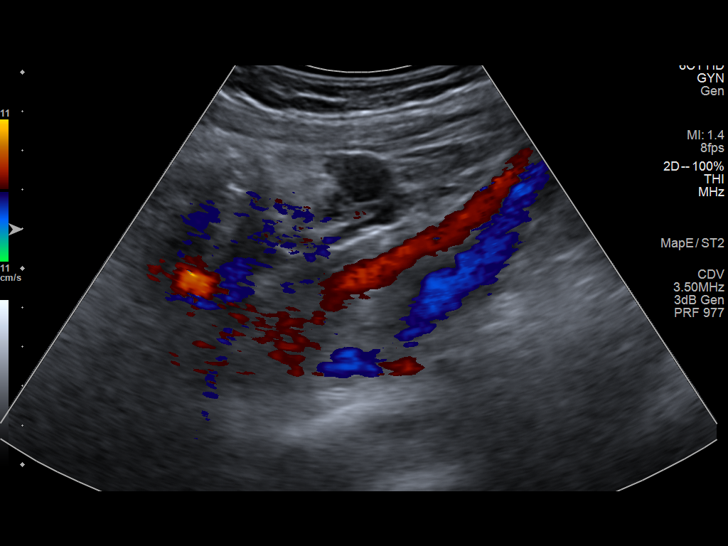
[im 42/126]
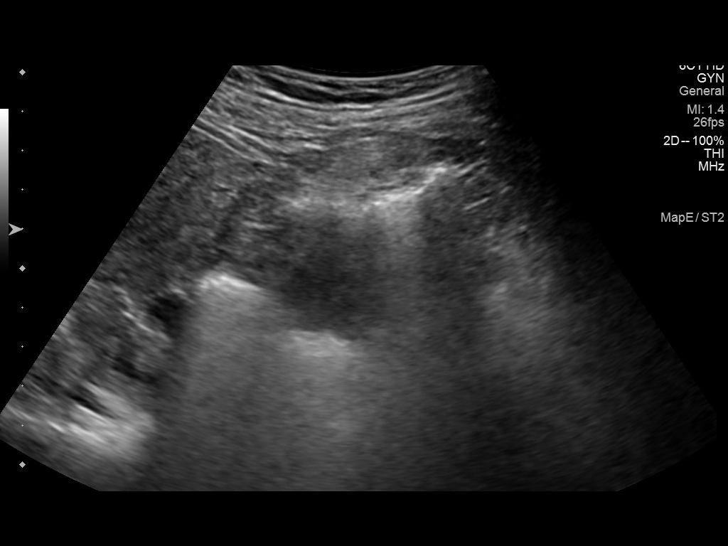
[im 53/126]
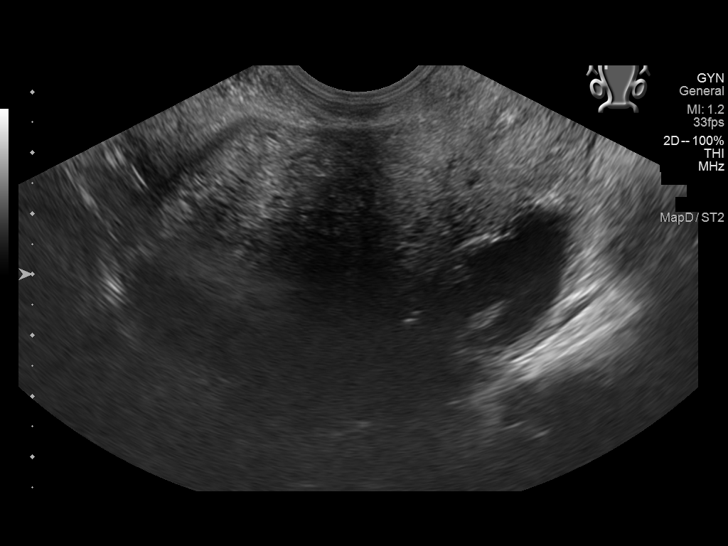
[im 63/126]
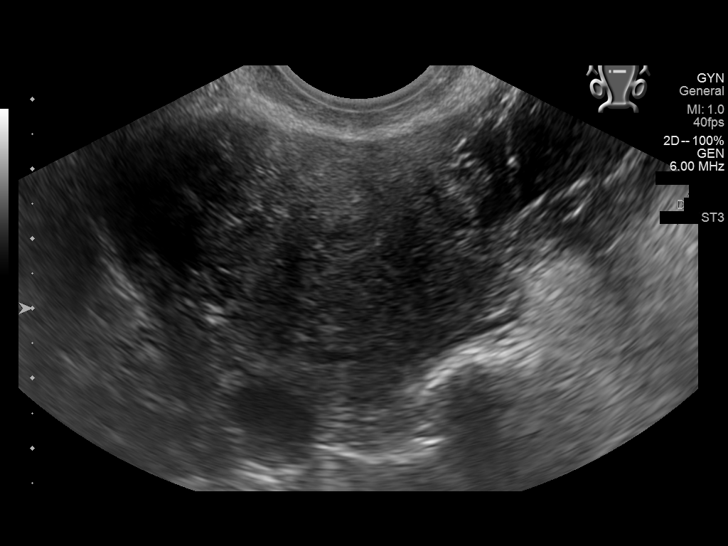
[im 73/126]
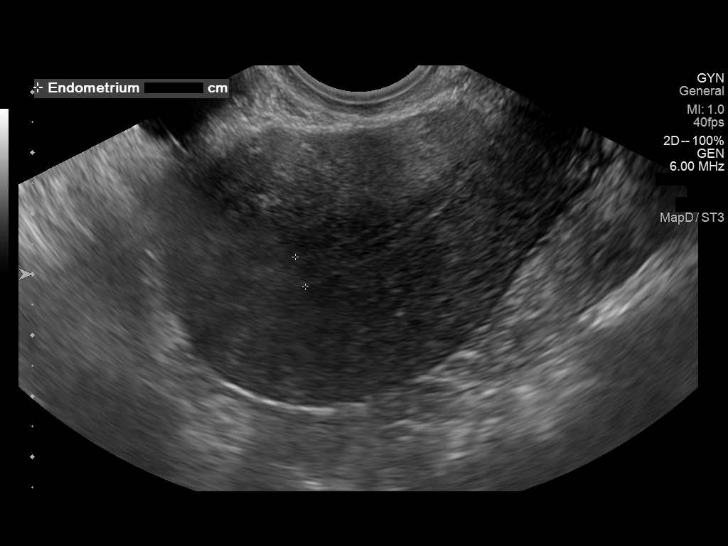
[im 84/126]
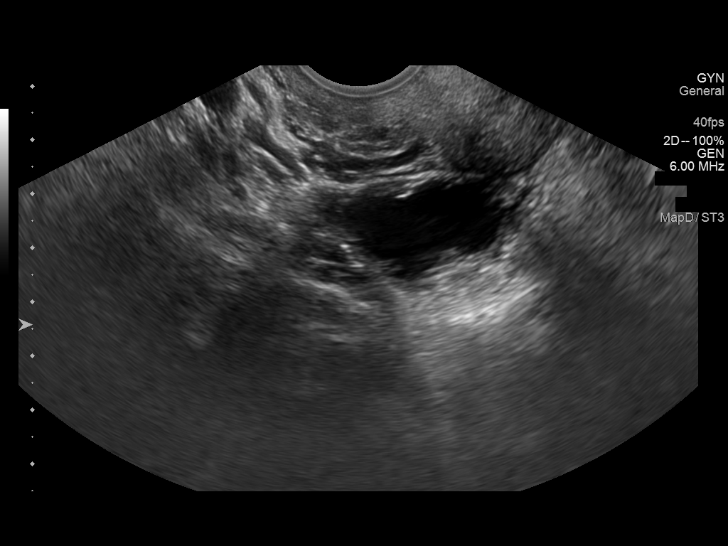
[im 94/126]
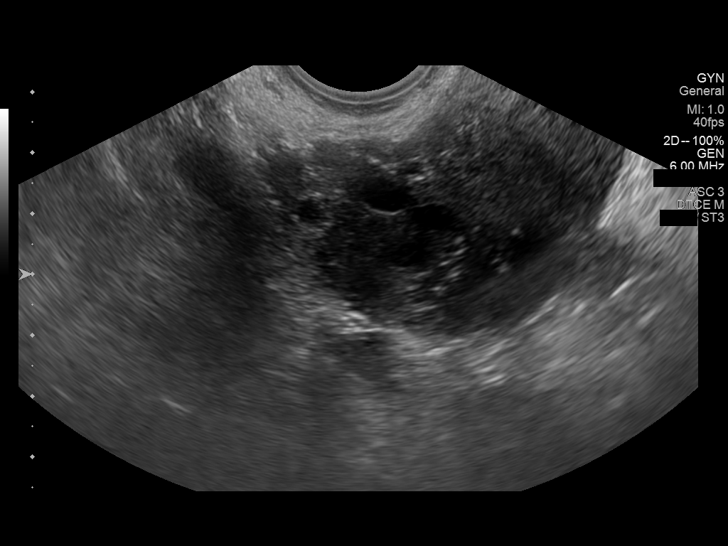
[im 105/126]
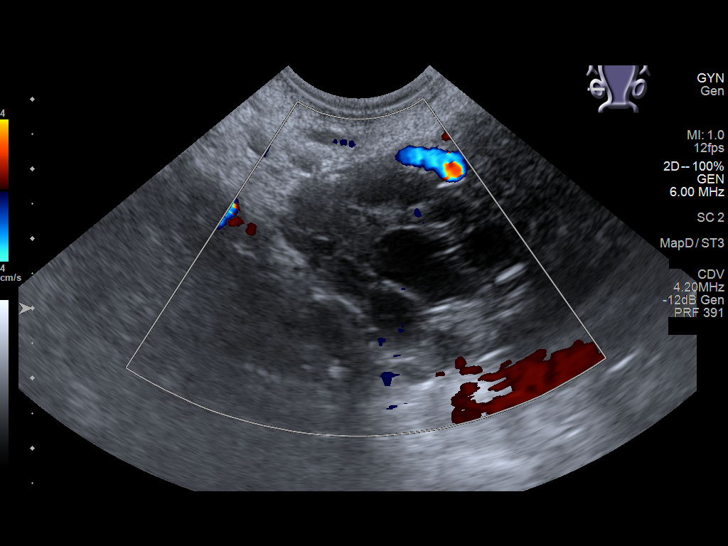
[im 115/126]
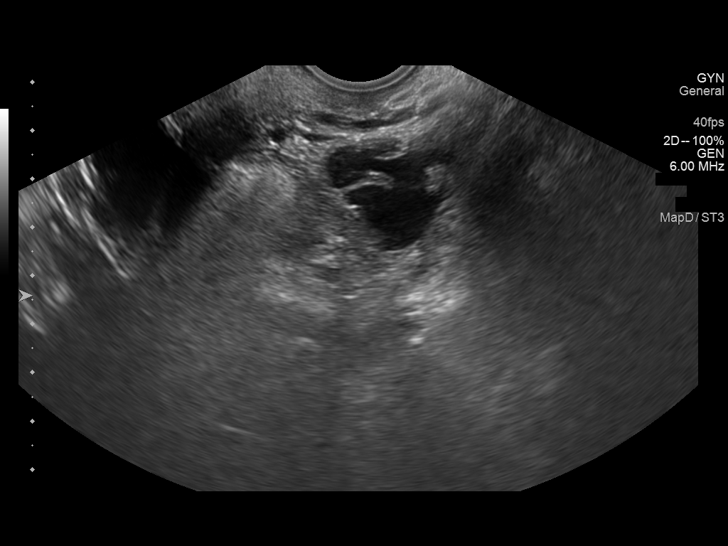
[im 126/126]
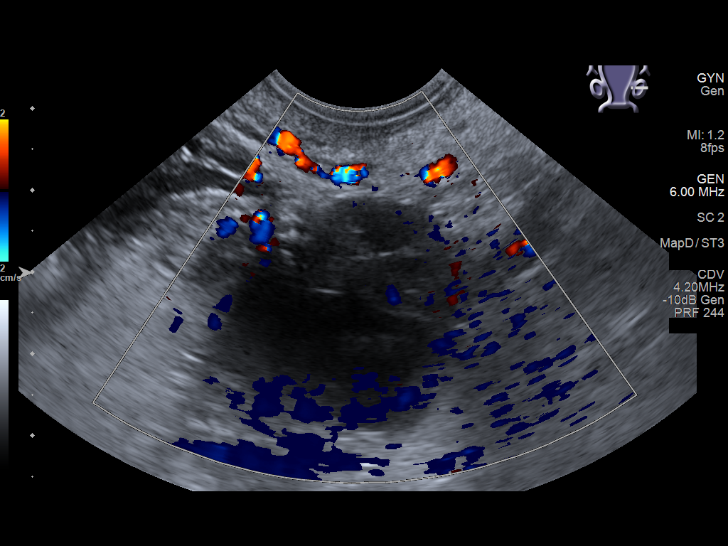

[13 of 25 positions shown; findings below may reference images not displayed]

FINDINGS: Uterus

Measurements: 9.4 x 4.8 x 4.4 cm = volume: 103 mL. No fibroids or
other mass visualized.

Endometrium

Thickness: 6.0.  No focal abnormality visualized.

Right ovary

Measurements: 3.6 x 2.6 x 2.5 cm = volume: 12.1 mL. Ovary difficult
visualized. Adjacent complex adnexal mass noted.

Left ovary

Measurements: 2.2 x 1.6 x 1.4 cm = volume: 2.6 mL. Ovary difficult
to visualize. Adjacent complex adnexal mass noted.

Other findings

No abnormal free fluid.
IMPRESSION: Complex adnexal masses are noted bilaterally. These findings may
represent hydro/pyosalpinx, particularly given previously identified
similar findings on CT. Underlying tumor cannot be excluded. A
process such as ectopic pregnancy cannot be excluded. The ovaries
are difficult to visualize. Follow-up exam can be obtained as
needed. No free pelvic fluid collections are identified.

.

## 2020-11-06 ENCOUNTER — Ambulatory Visit: Payer: BC Managed Care – PPO | Admitting: Obstetrics and Gynecology

## 2020-11-06 ENCOUNTER — Other Ambulatory Visit: Payer: BC Managed Care – PPO

## 2020-11-28 ENCOUNTER — Other Ambulatory Visit: Payer: BC Managed Care – PPO

## 2020-11-28 ENCOUNTER — Ambulatory Visit: Payer: BC Managed Care – PPO | Admitting: Obstetrics and Gynecology

## 2021-01-01 ENCOUNTER — Other Ambulatory Visit: Payer: Self-pay | Admitting: Obstetrics and Gynecology

## 2021-01-01 DIAGNOSIS — N7011 Chronic salpingitis: Secondary | ICD-10-CM

## 2021-01-01 DIAGNOSIS — Z0000 Encounter for general adult medical examination without abnormal findings: Secondary | ICD-10-CM

## 2021-01-01 DIAGNOSIS — R102 Pelvic and perineal pain: Secondary | ICD-10-CM

## 2021-01-08 ENCOUNTER — Ambulatory Visit: Payer: BC Managed Care – PPO | Admitting: Obstetrics and Gynecology

## 2021-01-08 ENCOUNTER — Other Ambulatory Visit: Payer: BC Managed Care – PPO

## 2021-01-09 ENCOUNTER — Other Ambulatory Visit: Payer: Self-pay

## 2021-01-09 ENCOUNTER — Ambulatory Visit (INDEPENDENT_AMBULATORY_CARE_PROVIDER_SITE_OTHER): Payer: BC Managed Care – PPO

## 2021-01-09 ENCOUNTER — Encounter: Payer: Self-pay | Admitting: Obstetrics and Gynecology

## 2021-01-09 ENCOUNTER — Other Ambulatory Visit (HOSPITAL_COMMUNITY)
Admission: RE | Admit: 2021-01-09 | Discharge: 2021-01-09 | Disposition: A | Discharge status: Home / Self Care | Payer: BC Managed Care – PPO | Source: Ambulatory Visit | Attending: Obstetrics and Gynecology | Admitting: Obstetrics and Gynecology

## 2021-01-09 ENCOUNTER — Ambulatory Visit (INDEPENDENT_AMBULATORY_CARE_PROVIDER_SITE_OTHER): Payer: BC Managed Care – PPO | Admitting: Obstetrics and Gynecology

## 2021-01-09 VITALS — BP 100/70 | Ht 59.0 in | Wt 116.6 lb

## 2021-01-09 DIAGNOSIS — Z01419 Encounter for gynecological examination (general) (routine) without abnormal findings: Secondary | ICD-10-CM

## 2021-01-09 DIAGNOSIS — Z23 Encounter for immunization: Secondary | ICD-10-CM

## 2021-01-09 DIAGNOSIS — Z124 Encounter for screening for malignant neoplasm of cervix: Secondary | ICD-10-CM

## 2021-01-09 DIAGNOSIS — R102 Pelvic and perineal pain: Secondary | ICD-10-CM | POA: Diagnosis not present

## 2021-01-09 DIAGNOSIS — Z0000 Encounter for general adult medical examination without abnormal findings: Secondary | ICD-10-CM

## 2021-01-09 DIAGNOSIS — Z113 Encounter for screening for infections with a predominantly sexual mode of transmission: Secondary | ICD-10-CM | POA: Insufficient documentation

## 2021-01-09 DIAGNOSIS — N7011 Chronic salpingitis: Secondary | ICD-10-CM

## 2021-01-09 DIAGNOSIS — Z1239 Encounter for other screening for malignant neoplasm of breast: Secondary | ICD-10-CM

## 2021-01-09 DIAGNOSIS — Z1231 Encounter for screening mammogram for malignant neoplasm of breast: Secondary | ICD-10-CM

## 2021-01-09 NOTE — Patient Instructions (Signed)
Institute of Medicine Recommended Dietary Allowances for Calcium and Vitamin D  Age (yr) Calcium Recommended Dietary Allowance (mg/day) Vitamin D Recommended Dietary Allowance (international units/day)  9-18 1,300 600  19-50 1,000 600  51-70 1,200 600  71 and older 1,200 800  Data from [employer] of [employer]. Dietary reference intakes: calcium, vitamin D. [place], [place]: National Academies Press; 2011.    Exercising to Stay Healthy To become healthy and stay healthy, it is recommended that you do moderate-intensity and vigorous-intensity exercise. You can tell that you are exercising at a moderate intensity if your heart starts beating faster and you start breathing faster but can still hold a conversation. You can tell that you are exercising at a vigorous intensity if you are breathing much harder and faster and cannot hold a conversation while exercising. Exercising regularly is important. It has many health benefits, such as:  Improving overall fitness, flexibility, and endurance.  Increasing bone density.  Helping with weight control.  Decreasing body fat.  Increasing muscle strength.  Reducing stress and tension.  Improving overall health. How often should I exercise? Choose an activity that you enjoy, and set realistic goals. Your health care provider can help you make an activity plan that works for you. Exercise regularly as told by your health care provider. This may include:  Doing strength training two times a week, such as: ? Lifting weights. ? Using resistance bands. ? Push-ups. ? Sit-ups. ? Yoga.  Doing a certain intensity of exercise for a given amount of time. Choose from these options: ? A total of 150 minutes of moderate-intensity exercise every week. ? A total of 75 minutes of vigorous-intensity exercise every week. ? A mix of moderate-intensity and vigorous-intensity exercise every week. Children, pregnant women, people who have not exercised  regularly, people who are overweight, and older adults may need to talk with a health care provider about what activities are safe to do. If you have a medical condition, be sure to talk with your health care provider before you start a new exercise program. What are some exercise ideas? Moderate-intensity exercise ideas include:  Walking 1 mile (1.6 km) in about 15 minutes.  Biking.  Hiking.  Golfing.  Dancing.  Water aerobics. Vigorous-intensity exercise ideas include:  Walking 4.5 miles (7.2 km) or more in about 1 hour.  Jogging or running 5 miles (8 km) in about 1 hour.  Biking 10 miles (16.1 km) or more in about 1 hour.  Lap swimming.  Roller-skating or in-line skating.  Cross-country skiing.  Vigorous competitive sports, such as football, basketball, and soccer.  Jumping rope.  Aerobic dancing.   What are some everyday activities that can help me to get exercise?  Yard work, such as: ? Pushing a lawn mower. ? Raking and bagging leaves.  Washing your car.  Pushing a stroller.  Shoveling snow.  Gardening.  Washing windows or floors. How can I be more active in my day-to-day activities?  Use stairs instead of an elevator.  Take a walk during your lunch break.  If you drive, park your car farther away from your work or school.  If you take public transportation, get off one stop early and walk the rest of the way.  Stand up or walk around during all of your indoor phone calls.  Get up, stretch, and walk around every 30 minutes throughout the day.  Enjoy exercise with a friend. Support to continue exercising will help you keep a regular routine of activity. What guidelines   can I follow while exercising?  Before you start a new exercise program, talk with your health care provider.  Do not exercise so much that you hurt yourself, feel dizzy, or get very short of breath.  Wear comfortable clothes and wear shoes with good support.  Drink plenty of  water while you exercise to prevent dehydration or heat stroke.  Work out until your breathing and your heartbeat get faster. Where to find more information  [place] Department of Health and Human Services: www.hhs.gov  Centers for Disease Control and Prevention (CDC): www.cdc.gov Summary  Exercising regularly is important. It will improve your overall fitness, flexibility, and endurance.  Regular exercise also will improve your overall health. It can help you control your weight, reduce stress, and improve your bone density.  Do not exercise so much that you hurt yourself, feel dizzy, or get very short of breath.  Before you start a new exercise program, talk with your health care provider. This information is not intended to replace advice given to you by your health care provider. Make sure you discuss any questions you have with your health care provider. Document Revised: 11/13/2017 Document Reviewed: 10/22/2017 Elsevier Patient Education  2021 Elsevier Inc.   Budget-Friendly Healthy Eating There are many ways to save money at the grocery store and continue to eat healthy. You can be successful if you:  Plan meals according to your budget.  Make a grocery list and only purchase food according to your grocery list.  Prepare food yourself at home. What are tips for following this plan? Reading food labels  Compare food labels between brand name foods and the store brand. Often the nutritional value is the same, but the store brand is lower cost.  Look for products that do not have added sugar, fat, or salt (sodium). These often cost the same but are healthier for you. Products may be labeled as: ? Sugar-free. ? Nonfat. ? Low-fat. ? Sodium-free. ? Low-sodium.  Look for lean ground beef labeled as at least 92% lean and 8% fat. Shopping  Buy only the items on your grocery list and go only to the areas of the store that have the items on your list.  Use coupons only for  foods and brands you normally buy. Avoid buying items you wouldn't normally buy simply because they are on sale.  Check online and in newspapers for weekly deals.  Buy healthy items from the bulk bins when available, such as herbs, spices, flour, pasta, nuts, and dried fruit.  Buy fruits and vegetables that are in season. Prices are usually lower on in-season produce.  Look at the unit price on the price tag. Use it to compare different brands and sizes to find out which item is the best deal.  Choose healthy items that are often low-cost, such as carrots, potatoes, apples, bananas, and oranges. Dried or canned beans are a low-cost protein source.  Buy in bulk and freeze extra food. Items you can buy in bulk include meats, fish, poultry, frozen fruits, and frozen vegetables.  Avoid buying "ready-to-eat" foods, such as pre-cut fruits and vegetables and pre-made salads.  If possible, shop around to discover where you can find the best prices. Consider other retailers such as dollar stores, larger wholesale stores, local fruit and vegetable stands, and farmers markets.  Do not shop when you are hungry. If you shop while hungry, it may be hard to stick to your list and budget.  Resist impulse buying. Use your grocery   list as your official plan for the week.  Buy a variety of vegetables and fruits by purchasing fresh, frozen, and canned items.  Look at the top and bottom shelves for deals. Foods at eye level (eye level of an adult or child) are usually more expensive.  Be efficient with your time when shopping. The more time you spend at the store, the more money you are likely to spend.  To save money when choosing more expensive foods like meats and dairy: ? Choose cheaper cuts of meat, such as bone-in chicken thighs and drumsticks instead of skinless and boneless chicken. When you are ready to prepare the chicken, you can remove the skin yourself to make it healthier. ? Choose lean meats  like chicken or turkey instead of beef. ? Choose canned seafood, such as tuna, salmon, or sardines. ? Buy eggs as a low-cost source of protein. ? Buy dried beans and peas, such as lentils, split peas, or kidney beans instead of meats. Dried beans and peas are a good alternative source of protein. ? Buy the larger tubs of yogurt instead of individual-sized containers.  Choose water instead of sodas and other sweetened beverages.  Avoid buying chips, cookies, and other "junk food." These items are usually expensive and not healthy.   Cooking  Make extra food and freeze the extras in meal-sized containers or in individual portions for fast meals and snacks.  Pre-cook on days when you have extra time to prepare meals in advance. You can keep these meals in the fridge or freezer and reheat for a quick meal.  When you come home from the grocery store, wash, peel, and cut fruits and vegetables so they are ready to use and eat. This will help reduce food waste. Meal planning  Do not eat out or get fast food. Prepare food at home.  Make a grocery list and make sure to bring it with you to the store. If you have a smart phone, you could use your phone to create your shopping list.  Plan meals and snacks according to a grocery list and budget you create.  Use leftovers in your meal plan for the week.  Look for recipes where you can cook once and make enough food for two meals.  Prepare budget-friendly types of meals like stews, casseroles, and stir-fry dishes.  Try some meatless meals or try "no cook" meals like salads.  Make sure that half your plate is filled with fruits or vegetables. Choose from fresh, frozen, or canned fruits and vegetables. If eating canned, remember to rinse them before eating. This will remove any excess salt added for packaging. Summary  Eating healthy on a budget is possible if you plan your meals according to your budget, purchase according to your budget and  grocery list, and prepare food yourself.  Tips for buying more food on a limited budget include buying generic brands, using coupons only for foods you normally buy, and buying healthy items from the bulk bins when available.  Tips for buying cheaper food to replace expensive food include choosing cheaper, lean cuts of meat, and buying dried beans and peas. This information is not intended to replace advice given to you by your health care provider. Make sure you discuss any questions you have with your health care provider. Document Revised: 09/13/2020 Document Reviewed: 09/13/2020 Elsevier Patient Education  2021 [employer] Inc.   Bone Health Bones protect organs, store calcium, anchor muscles, and support the whole body. Keeping your bones   strong is important, especially as you get older. You can take actions to help keep your bones strong and healthy. Why is keeping my bones healthy important? Keeping your bones healthy is important because your body constantly replaces bone cells. Cells get old, and new cells take their place. As we age, we lose bone cells because the body may not be able to make enough new cells to replace the old cells. The amount of bone cells and bone tissue you have is referred to as bone mass. The higher your bone mass, the stronger your bones. The aging process leads to an overall loss of bone mass in the body, which can increase the likelihood of:  Joint pain and stiffness.  Broken bones.  A condition in which the bones become weak and brittle (osteoporosis). A large decline in bone mass occurs in older adults. In women, it occurs about the time of menopause.   What actions can I take to keep my bones healthy? Good health habits are important for maintaining healthy bones. This includes eating nutritious foods and exercising regularly. To have healthy bones, you need to get enough of the right minerals and vitamins. Most nutrition experts recommend getting these  nutrients from the foods that you eat. In some cases, taking supplements may also be recommended. Doing certain types of exercise is also important for bone health. What are the nutritional recommendations for healthy bones? Eating a well-balanced diet with plenty of calcium and vitamin D will help to protect your bones. Nutritional recommendations vary from person to person. Ask your health care provider what is healthy for you. Here are some general guidelines. Get enough calcium Calcium is the most important (essential) mineral for bone health. Most people can get enough calcium from their diet, but supplements may be recommended for people who are at risk for osteoporosis. Good sources of calcium include:  Dairy products, such as low-fat or nonfat milk, cheese, and yogurt.  Dark green leafy vegetables, such as bok choy and broccoli.  Calcium-fortified foods, such as orange juice, cereal, bread, soy beverages, and tofu products.  Nuts, such as almonds. Follow these recommended amounts for daily calcium intake:  Children, age 1-3: 700 mg.  Children, age 4-8: 1,000 mg.  Children, age 9-13: 1,300 mg.  Teens, age 14-18: 1,300 mg.  Adults, age 19-50: 1,000 mg.  Adults, age 51-70: ? Men: 1,000 mg. ? Women: 1,200 mg.  Adults, age 71 or older: 1,200 mg.  Pregnant and breastfeeding females: ? Teens: 1,300 mg. ? Adults: 1,000 mg. Get enough vitamin D Vitamin D is the most essential vitamin for bone health. It helps the body absorb calcium. Sunlight stimulates the skin to make vitamin D, so be sure to get enough sunlight. If you live in a cold climate or you do not get outside often, your health care provider may recommend that you take vitamin D supplements. Good sources of vitamin D in your diet include:  Egg yolks.  Saltwater fish.  Milk and cereal fortified with vitamin D. Follow these recommended amounts for daily vitamin D intake:  Children and teens, age 1-18: 600  international units.  Adults, age 50 or younger: 400-800 international units.  Adults, age 51 or older: 800-1,000 international units. Get other important nutrients Other nutrients that are important for bone health include:  Phosphorus. This mineral is found in meat, poultry, dairy foods, nuts, and legumes. The recommended daily intake for adult men and adult women is 700 mg.  Magnesium. This mineral   is found in seeds, nuts, dark green vegetables, and legumes. The recommended daily intake for adult men is 400-420 mg. For adult women, it is 310-320 mg.  Vitamin K. This vitamin is found in green leafy vegetables. The recommended daily intake is 120 mg for adult men and 90 mg for adult women.   What type of physical activity is best for building and maintaining healthy bones? Weight-bearing and strength-building activities are important for building and maintaining healthy bones. Weight-bearing activities cause muscles and bones to work against gravity. Strength-building activities increase the strength of the muscles that support bones. Weight-bearing and muscle-building activities include:  Walking and hiking.  Jogging and running.  Dancing.  Gym exercises.  Lifting weights.  Tennis and racquetball.  Climbing stairs.  Aerobics. Adults should get at least 30 minutes of moderate physical activity on most days. Children should get at least 60 minutes of moderate physical activity on most days. Ask your health care provider what type of exercise is best for you.   How can I find out if my bone mass is low? Bone mass can be measured with an X-ray test called a bone mineral density (BMD) test. This test is recommended for all women who are age 65 or older. It may also be recommended for:  Men who are age 70 or older.  People who are at risk for osteoporosis because of: ? Having bones that break easily. ? Having a long-term disease that weakens bones, such as kidney disease or  rheumatoid arthritis. ? Having menopause earlier than normal. ? Taking medicine that weakens bones, such as steroids, thyroid hormones, or hormone treatment for breast cancer or prostate cancer. ? Smoking. ? Drinking three or more alcoholic drinks a day. If you find that you have a low bone mass, you may be able to prevent osteoporosis or further bone loss by changing your diet and lifestyle. Where can I find more information? For more information, check out the following websites:  National Osteoporosis Foundation: www.nof.org/patients  National Institutes of Health: www.bones.nih.gov  International Osteoporosis Foundation: www.iofbonehealth.org Summary  The aging process leads to an overall loss of bone mass in the body, which can increase the likelihood of broken bones and osteoporosis.  Eating a well-balanced diet with plenty of calcium and vitamin D will help to protect your bones.  Weight-bearing and strength-building activities are also important for building and maintaining strong bones.  Bone mass can be measured with an X-ray test called a bone mineral density (BMD) test. This information is not intended to replace advice given to you by your health care provider. Make sure you discuss any questions you have with your health care provider. Document Revised: 12/28/2017 Document Reviewed: 12/28/2017 Elsevier Patient Education  2021 [employer] Inc.   

## 2021-01-09 NOTE — Progress Notes (Signed)
Gynecology Annual Exam  PCP: Pcp, No  Chief Complaint:  Chief Complaint  Patient presents with  . Gynecologic Exam    Annual Exam    History of Present Illness: Patient is a 40 y.o. [id number] presents for annual exam. The patient has no complaints today.   LMP: Patient's last menstrual period was 12/14/2019. Average Interval: regular,  monthly Duration of flow: 4 days Heavy Menses: yes, but reports she has always had 1-23 days of heavy bleeding. It is unchanged and does not bother her. Intermenstrual Bleeding: no Dysmenorrhea: no  The patient does perform self breast exams.  There is notable family history of breast or ovarian cancer in her family. Reports that she has an Engineer, mining and a cousin who had ovarian cancer.  The patient has regular exercise: works a physical job in an Scientist, water quality  The patient denies current symptoms of depression.   Review of Systems: Review of Systems  Constitutional: Negative for chills, fever, malaise/fatigue and weight loss.  HENT: Negative for congestion, hearing loss and sinus pain.   Eyes: Negative for blurred vision and double vision.  Respiratory: Negative for cough, sputum production, shortness of breath and wheezing.   Cardiovascular: Negative for chest pain, palpitations, orthopnea and leg swelling.  Gastrointestinal: Negative for abdominal pain, constipation, diarrhea, nausea and vomiting.  Genitourinary: Negative for dysuria, flank pain, frequency, hematuria and urgency.  Musculoskeletal: Negative for back pain, falls and joint pain.  Skin: Negative for itching and rash.  Neurological: Negative for dizziness and headaches.  Psychiatric/Behavioral: Negative for depression, substance abuse and suicidal ideas. The patient is not nervous/anxious.     Past Medical History:  History reviewed. No pertinent past medical history.  Past Surgical History:  Past Surgical History:  Procedure Laterality Date  . NO PAST SURGERIES       Gynecologic History:  Patient's last menstrual period was 12/14/2019. Menarche: 10  History of fibroids, polyps, or ovarian cysts? :  denies History of PCOS? denies Hstory of Endometriosis? denies History of abnormal pap smears? denies Have you had any sexually transmitted infections in the past?  has had infections with chlamydia and gonorrhea in the past- unsure exact time, maybe 6 years ago  She has not recieved HPV vaccination in the past. She would like to proceed with HPV vaccination today.   Last Pap: Results were: 09/28/2018 NIL and HR HPV negative    She is sexually active with men.   She has dyspareunia. She denies postcoital bleeding.    Obstetric History: [id number]  Family History:  Family History  Problem Relation Age of Onset  . Diabetes Mother   . Cancer Cousin 84       thyroid    Social History:  Social History   Socioeconomic History  . Marital status: Married    Spouse name: Not on file  . Number of children: 2  . Years of education: 27  . Highest education level: Not on file  Occupational History  . Occupation: ASSEMBLY  Tobacco Use  . Smoking status: Never Smoker  . Smokeless tobacco: Never Used  Vaping Use  . Vaping Use: Never used  Substance and Sexual Activity  . Alcohol use: Never  . Drug use: Never  . Sexual activity: Yes    Birth control/protection: None  Other Topics Concern  . Not on file  Social History Narrative  . Not on file   Social Determinants of Health   Financial Resource Strain: Not on file  Food Insecurity:  Not on file  Transportation Needs: Not on file  Physical Activity: Not on file  Stress: Not on file  Social Connections: Not on file  Intimate Partner Violence: Not on file    Allergies:  No Known Allergies  Medications: Prior to Admission medications   Medication Sig Start Date End Date Taking? Authorizing Provider  HYDROcodone-acetaminophen (NORCO) 5-325 MG tablet Take 1 tablet by mouth every 8  (eight) hours as needed for moderate pain. Patient not taking: Reported on 06/15/2019 05/31/19   Verlee Monte, NP  loperamide (IMODIUM) 2 MG capsule Take 1 capsule (2 mg total) by mouth 4 (four) times daily as needed for diarrhea or loose stools. Patient not taking: Reported on 06/15/2019 05/31/19   Verlee Monte, NP  ondansetron (ZOFRAN) 4 MG tablet Take 1 tablet (4 mg total) by mouth every 6 (six) hours. Patient not taking: Reported on 06/15/2019 05/31/19   Verlee Monte, NP    Physical Exam Vitals: Blood pressure 100/70, height 4\' 11"  (1.499 m), last menstrual period 12/14/2019.  Physical Exam Constitutional:      Appearance: She is well-developed.  Genitourinary:     Vagina and uterus normal.     There is no lesion on the right labia.     There is no lesion on the left labia.    No lesions in the vagina.      Right Adnexa: no mass present.    Left Adnexa: no mass present.    No cervical motion tenderness.  Breasts:     Right: No inverted nipple, mass, nipple discharge or skin change.     Left: No inverted nipple, mass, nipple discharge or skin change.    HENT:     Head: Normocephalic and atraumatic.  Eyes:     Extraocular Movements: EOM normal.  Neck:     Thyroid: No thyromegaly.  Cardiovascular:     Rate and Rhythm: Normal rate and regular rhythm.     Heart sounds: Normal heart sounds.  Pulmonary:     Effort: Pulmonary effort is normal.     Breath sounds: Normal breath sounds.  Abdominal:     General: Bowel sounds are normal. There is no distension.     Palpations: Abdomen is soft. There is no mass.  Musculoskeletal:     Cervical back: Neck supple.  Neurological:     Mental Status: She is alert and oriented to person, place, and time.  Skin:    General: Skin is warm and dry.  Psychiatric:        Mood and Affect: Mood and affect normal.        Behavior: Behavior normal.        Thought Content: Thought content normal.        Judgment: Judgment normal.  Vitals reviewed.       Female chaperone present for pelvic and breast  portions of the physical exam  Assessment: 40 y.o. 40 routine annual exam  Plan: Problem List Items Addressed This Visit   None   Visit Diagnoses    Encounter for annual routine gynecological examination    -  Primary   Health maintenance examination       Breast cancer screening by mammogram       Relevant Orders   MM 3D SCREEN BREAST BILATERAL   Cervical cancer screening       Relevant Orders   Cytology - PAP   Encounter for gynecological examination without abnormal finding  Encounter for screening breast examination       Screen for STD (sexually transmitted disease)       Relevant Orders   Cytology - PAP   HIV antibody (with reflex)   RPR   Hepatitis panel, acute      1) Mammogram - recommend yearly screening mammogram.  Mammogram Was ordered today  2) STI screening was offered and accepted. She would like testing for HIV, syphillis, chlamydia, gonorrhea and trichomonas.  3) ASCCP guidelines and rational discussed.  Patient opts for yearly screening interval  4) Contraception - declines  5) Routine healthcare maintenance including cholesterol, diabetes screening discussed managed by PCP  6) HPV vaccination initiated. Requested to schedule nurse visits in 2 months and 6 months to complete her HPV vaccination.   Adelene Idler MD, [facility], [facility] 01/09/2021 2:54 PM

## 2021-01-09 NOTE — Progress Notes (Signed)
Annual Exam

## 2021-01-09 NOTE — Addendum Note (Signed)
Addended by: Clement Husbands A on: 01/09/2021 03:44 PM   Modules accepted: Orders

## 2021-01-10 LAB — HIV ANTIBODY (ROUTINE TESTING W REFLEX): HIV Screen 4th Generation wRfx: NONREACTIVE

## 2021-01-10 LAB — HEPATITIS PANEL, ACUTE
Hep A IgM: NEGATIVE
Hep B C IgM: NEGATIVE
Hep C Virus Ab: <0.1 s/co ratio (ref 0.0–0.9)
Hepatitis B Surface Ag: NEGATIVE

## 2021-01-10 LAB — RPR: RPR Ser Ql: NONREACTIVE

## 2021-01-17 LAB — CYTOLOGY - PAP
Adequacy: ABNORMAL
Chlamydia: NEGATIVE
Comment: NEGATIVE
Comment: NEGATIVE
Comment: NORMAL
Neisseria Gonorrhea: NEGATIVE
Trichomonas: NEGATIVE

## 2021-03-13 ENCOUNTER — Ambulatory Visit: Payer: Medicaid Other

## 2021-05-15 ENCOUNTER — Ambulatory Visit: Payer: Medicaid Other

## 2024-06-14 ENCOUNTER — Encounter: Payer: Self-pay | Admitting: Podiatry

## 2024-06-14 ENCOUNTER — Ambulatory Visit: Admitting: Podiatry

## 2024-06-14 ENCOUNTER — Ambulatory Visit (INDEPENDENT_AMBULATORY_CARE_PROVIDER_SITE_OTHER)

## 2024-06-14 VITALS — Ht 59.0 in | Wt 116.6 lb

## 2024-06-14 DIAGNOSIS — M2012 Hallux valgus (acquired), left foot: Secondary | ICD-10-CM

## 2024-06-14 DIAGNOSIS — M7752 Other enthesopathy of left foot: Secondary | ICD-10-CM | POA: Diagnosis not present

## 2024-06-14 DIAGNOSIS — M2042 Other hammer toe(s) (acquired), left foot: Secondary | ICD-10-CM

## 2024-06-14 NOTE — Progress Notes (Signed)
   Chief Complaint  Patient presents with   Foot Pain    Pt is here due to left foot pain that has been going on for a while states she wears steel toe boots while at work and shoe is causing pain to left foot especially near the big toe bunion area has a corn on the 3rd toe and a callous on the bottom, she states all the area's are painful to walk on.    Subjective: 43 y.o. female presents today as a new patient with her husband for evaluation of pain and tenderness associated to a symptomatic bunion deformity to the left foot as well as hammertoes.  Progressive onset over a few years now.  She has tried different shoe gear modifications without any improvement or relief.  Presenting for further treatment and evaluation  No past medical history on file.  Past Surgical History:  Procedure Laterality Date   NO PAST SURGERIES      No Known Allergies   Objective: Physical Exam General: The patient is alert and oriented x3 in no acute distress.  Dermatology: Skin is cool, dry and supple bilateral lower extremities. Negative for open lesions or macerations.  Vascular: Palpable pedal pulses bilaterally. No edema or erythema noted. Capillary refill within normal limits.  Neurological: Grossly intact via light touch  Musculoskeletal Exam: Clinical evidence of bunion deformity noted to the respective foot. There is moderate pain on palpation range of motion of the first MPJ. Lateral deviation of the hallux noted consistent with hallux abductovalgus.  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  Hammertoe contracture deformity noted to the lesser digits.  Joint spaces preserved  Assessment: 1.  Hallux valgus left 3.  Hammertoe lesser digits left with overlying corn to the DIPJ of the left third digit   Plan of Care:  -Patient was evaluated. X-Rays reviewed. -Unfortunately the patient has had pain and tenderness  associated with a bunion deformity for a few years now.  It has progressively gotten worse.  She has tried different shoe gear modifications with no improvement.  She takes anti-inflammatory PRN.  I do believe it is appropriate to discuss surgery at this time -Surgery was discussed in detail with the patient including the risk benefits advantages and disadvantages of the procedure.  No guarantees were expressed or implied.  All patient questions were answered.  After discussion with the patient she would like to proceed with surgery -Authorization for surgery was initiated today.  Surgery will consist of bunionectomy with distal first metatarsal osteotomy left.  Hammertoe arthroplasty third digit left -Return to clinic 1 week postop  *works at [employer]. Planning for 12 weeks off of work      Thresa EMERSON Sar, DPM [facility]  Dr. Thresa EMERSON Sar, DPM    [address] [place], [place] [address]                Office [phone]  Fax [phone]

## 2024-07-13 ENCOUNTER — Telehealth: Payer: Self-pay | Admitting: Podiatry

## 2024-07-13 NOTE — Telephone Encounter (Signed)
 Pt called and left message she needed to cancel her surgery on 8/7   I returned call and she is canceling for now and will call to r/s going to be out of town.  I notified surgery center as well

## 2024-07-26 ENCOUNTER — Telehealth: Payer: Self-pay | Admitting: Podiatry

## 2024-07-26 NOTE — Telephone Encounter (Signed)
 Recd [facility] forms and faxed back to them, no surgery.

## 2024-07-29 ENCOUNTER — Encounter: Admitting: Podiatry

## 2024-08-05 ENCOUNTER — Encounter: Admitting: Podiatry

## 2024-08-19 ENCOUNTER — Encounter: Admitting: Podiatry
